# Patient Record
Sex: Male | Born: 1995 | Race: White | Hispanic: No | Marital: Single | Smoking: Current every day smoker
Health system: Southern US, Community
[De-identification: ages and names within clinical notes are randomized; demographics above are authoritative.]

## PROBLEM LIST (undated history)

## (undated) DIAGNOSIS — F419 Anxiety disorder, unspecified: Secondary | ICD-10-CM

## (undated) DIAGNOSIS — R519 Headache, unspecified: Secondary | ICD-10-CM

## (undated) DIAGNOSIS — R51 Headache: Secondary | ICD-10-CM

---

## 2017-11-27 ENCOUNTER — Other Ambulatory Visit: Payer: Self-pay | Admitting: Psychiatry

## 2017-11-27 DIAGNOSIS — G96810 Intracranial hypotension, unspecified: Secondary | ICD-10-CM

## 2017-11-27 DIAGNOSIS — G9389 Other specified disorders of brain: Principal | ICD-10-CM

## 2017-12-17 ENCOUNTER — Ambulatory Visit
Admission: RE | Admit: 2017-12-17 | Discharge: 2017-12-17 | Disposition: A | Discharge status: Home / Self Care | Payer: 59 | Source: Ambulatory Visit | Attending: Psychiatry | Admitting: Psychiatry

## 2017-12-17 ENCOUNTER — Inpatient Hospital Stay
Admission: AD | Admit: 2017-12-17 | Discharge: 2017-12-24 | DRG: 885 | Disposition: A | Discharge status: Home / Self Care | Payer: 59 | Attending: Psychiatry | Admitting: Psychiatry

## 2017-12-17 ENCOUNTER — Encounter: Payer: Self-pay | Admitting: Emergency Medicine

## 2017-12-17 ENCOUNTER — Emergency Department
Admission: EM | Admit: 2017-12-17 | Discharge: 2017-12-17 | Disposition: A | Discharge status: Other Acute Inpatient Hospital | Payer: 59 | Attending: Emergency Medicine | Admitting: Emergency Medicine

## 2017-12-17 ENCOUNTER — Other Ambulatory Visit: Payer: Self-pay

## 2017-12-17 DIAGNOSIS — F312 Bipolar disorder, current episode manic severe with psychotic features: Secondary | ICD-10-CM | POA: Diagnosis not present

## 2017-12-17 DIAGNOSIS — F1220 Cannabis dependence, uncomplicated: Secondary | ICD-10-CM | POA: Diagnosis present

## 2017-12-17 DIAGNOSIS — R51 Headache: Secondary | ICD-10-CM

## 2017-12-17 DIAGNOSIS — G9389 Other specified disorders of brain: Secondary | ICD-10-CM | POA: Insufficient documentation

## 2017-12-17 DIAGNOSIS — G43909 Migraine, unspecified, not intractable, without status migrainosus: Secondary | ICD-10-CM | POA: Diagnosis present

## 2017-12-17 DIAGNOSIS — G96810 Intracranial hypotension, unspecified: Secondary | ICD-10-CM

## 2017-12-17 DIAGNOSIS — F17200 Nicotine dependence, unspecified, uncomplicated: Secondary | ICD-10-CM

## 2017-12-17 DIAGNOSIS — Z79899 Other long term (current) drug therapy: Secondary | ICD-10-CM

## 2017-12-17 DIAGNOSIS — R519 Headache, unspecified: Secondary | ICD-10-CM

## 2017-12-17 DIAGNOSIS — F3010 Manic episode without psychotic symptoms, unspecified: Secondary | ICD-10-CM

## 2017-12-17 DIAGNOSIS — F419 Anxiety disorder, unspecified: Secondary | ICD-10-CM | POA: Diagnosis present

## 2017-12-17 DIAGNOSIS — F17210 Nicotine dependence, cigarettes, uncomplicated: Secondary | ICD-10-CM | POA: Diagnosis present

## 2017-12-17 DIAGNOSIS — R443 Hallucinations, unspecified: Secondary | ICD-10-CM | POA: Diagnosis present

## 2017-12-17 HISTORY — DX: Anxiety disorder, unspecified: F41.9

## 2017-12-17 HISTORY — DX: Headache: R51

## 2017-12-17 HISTORY — DX: Headache, unspecified: R51.9

## 2017-12-17 LAB — CBC
HCT: 46.6 % (ref 39.0–52.0)
Hemoglobin: 16.7 g/dL (ref 13.0–17.0)
MCH: 30.9 pg (ref 26.0–34.0)
MCHC: 35.8 g/dL (ref 30.0–36.0)
MCV: 86.3 fL (ref 80.0–100.0)
PLATELETS: 257 10*3/uL (ref 150–400)
RBC: 5.4 MIL/uL (ref 4.22–5.81)
RDW: 11.9 % (ref 11.5–15.5)
WBC: 10.9 10*3/uL — ABNORMAL HIGH (ref 4.0–10.5)
nRBC: 0 % (ref 0.0–0.2)

## 2017-12-17 LAB — COMPREHENSIVE METABOLIC PANEL
ALT: 12 U/L (ref 0–44)
AST: 22 U/L (ref 15–41)
Albumin: 5.2 g/dL — ABNORMAL HIGH (ref 3.5–5.0)
Alkaline Phosphatase: 111 U/L (ref 38–126)
Anion gap: 10 (ref 5–15)
BUN: 18 mg/dL (ref 6–20)
CHLORIDE: 100 mmol/L (ref 98–111)
CO2: 28 mmol/L (ref 22–32)
CREATININE: 0.99 mg/dL (ref 0.61–1.24)
Calcium: 9.9 mg/dL (ref 8.9–10.3)
Glucose, Bld: 105 mg/dL — ABNORMAL HIGH (ref 70–99)
Potassium: 3.8 mmol/L (ref 3.5–5.1)
Sodium: 138 mmol/L (ref 135–145)
TOTAL PROTEIN: 8.7 g/dL — AB (ref 6.5–8.1)
Total Bilirubin: 1 mg/dL (ref 0.3–1.2)

## 2017-12-17 LAB — URINE DRUG SCREEN, QUALITATIVE (ARMC ONLY)
Amphetamines, Ur Screen: NOT DETECTED
Barbiturates, Ur Screen: NOT DETECTED
Benzodiazepine, Ur Scrn: NOT DETECTED
Cannabinoid 50 Ng, Ur ~~LOC~~: POSITIVE — AB
Cocaine Metabolite,Ur ~~LOC~~: NOT DETECTED
MDMA (Ecstasy)Ur Screen: NOT DETECTED
Methadone Scn, Ur: NOT DETECTED
Opiate, Ur Screen: NOT DETECTED
Phencyclidine (PCP) Ur S: NOT DETECTED
Tricyclic, Ur Screen: NOT DETECTED

## 2017-12-17 LAB — ETHANOL

## 2017-12-17 MED ORDER — TEMAZEPAM 7.5 MG PO CAPS
15.0000 mg | ORAL_CAPSULE | Freq: Every evening | ORAL | Status: DC | PRN
  Administered 2017-12-17: 15 mg via ORAL
  Filled 2017-12-17: qty 2

## 2017-12-17 MED ORDER — GADOBENATE DIMEGLUMINE 529 MG/ML IV SOLN: 7.0000 mL | Freq: Once | INTRAVENOUS | Status: DC | PRN

## 2017-12-17 MED ORDER — LORAZEPAM 2 MG/ML IJ SOLN: 2.0000 mg | INTRAMUSCULAR | Status: DC | PRN

## 2017-12-17 MED ORDER — MAGNESIUM HYDROXIDE 400 MG/5ML PO SUSP: 30.0000 mL | Freq: Every day | ORAL | Status: DC | PRN

## 2017-12-17 MED ORDER — LORAZEPAM 2 MG PO TABS
ORAL_TABLET | ORAL | Status: AC
  Filled 2017-12-17: qty 1

## 2017-12-17 MED ORDER — ACETAMINOPHEN 325 MG PO TABS: 650.0000 mg | ORAL_TABLET | Freq: Four times a day (QID) | ORAL | Status: DC | PRN

## 2017-12-17 MED ORDER — QUETIAPINE FUMARATE 25 MG PO TABS
100.0000 mg | ORAL_TABLET | Freq: Every day | ORAL | Status: DC
  Administered 2017-12-17: 100 mg via ORAL
  Filled 2017-12-17: qty 4

## 2017-12-17 MED ORDER — HYDROXYZINE HCL 50 MG PO TABS
50.0000 mg | ORAL_TABLET | Freq: Three times a day (TID) | ORAL | Status: DC | PRN
  Administered 2017-12-18 – 2017-12-20 (×3): 50 mg via ORAL
  Filled 2017-12-17 (×3): qty 1

## 2017-12-17 MED ORDER — LORAZEPAM 2 MG PO TABS
2.0000 mg | ORAL_TABLET | ORAL | Status: DC | PRN
  Administered 2017-12-18 – 2017-12-20 (×5): 2 mg via ORAL
  Filled 2017-12-17 (×5): qty 1

## 2017-12-17 MED ORDER — GADOBUTROL 1 MMOL/ML IV SOLN
7.0000 mL | Freq: Once | INTRAVENOUS | Status: AC | PRN
  Administered 2017-12-17: 7 mL via INTRAVENOUS

## 2017-12-17 MED ORDER — TEMAZEPAM 15 MG PO CAPS: 15.0000 mg | ORAL_CAPSULE | Freq: Every evening | ORAL | Status: DC | PRN

## 2017-12-17 MED ORDER — ALUM & MAG HYDROXIDE-SIMETH 200-200-20 MG/5ML PO SUSP: 30.0000 mL | ORAL | Status: DC | PRN

## 2017-12-17 MED ORDER — QUETIAPINE FUMARATE 100 MG PO TABS
100.0000 mg | ORAL_TABLET | Freq: Every day | ORAL | Status: DC
  Administered 2017-12-18: 100 mg via ORAL
  Filled 2017-12-17: qty 1

## 2017-12-17 MED ORDER — LORAZEPAM 2 MG PO TABS
2.0000 mg | ORAL_TABLET | ORAL | Status: DC | PRN
  Administered 2017-12-17: 2 mg via ORAL
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
## 2017-12-17 NOTE — ED Notes (Signed)
Pt father name: Leanna SatoSteve Wirkkala  Phone#: [phone]

## 2017-12-17 NOTE — [facility] Group Notes (Signed)
[facility] Group Notes:  (Nursing/MHT/Case Management/Adjunct)  Date:  12/17/2017  Time:  10:29 PM  Type of Therapy:  Group Therapy  Participation Level:  Did Not Attend  Participation Quality:  Pt. was not here.                Mayra NeerJackie L Evy Lutterman 12/17/2017, 10:29 PM
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
## 2017-12-17 NOTE — Tx Team (Signed)
Initial Treatment Plan 12/17/2017 11:55 PM Jack ParentsKenneth Wright RUE:[id number]    PATIENT STRESSORS: Health problems Medication change or noncompliance Substance abuse   PATIENT STRENGTHS: Average or above average intelligence Capable of independent living Supportive family/friends   PATIENT IDENTIFIED PROBLEMS:   Altered mental status     School work anxietyNurse, learning disability( Exams)    Marijuana use           DISCHARGE CRITERIA:  Adequate post-discharge living arrangements Medical problems require only outpatient monitoring Motivation to continue treatment in a less acute level of care Reduction of life-threatening or endangering symptoms to within safe limits  PRELIMINARY DISCHARGE PLAN: Attend 12-step recovery group Outpatient therapy Return to previous living arrangement  PATIENT/FAMILY INVOLVEMENT: This treatment plan has been presented to and reviewed with the patient, Jack ParentsKenneth Wright,   The patient  have been given the opportunity to ask questions and make suggestions.  Lelan PonsAlexander  Chesney Suares, RN 12/17/2017, 11:55 PM

## 2017-12-17 NOTE — ED Provider Notes (Signed)
[facility] Emergency Department Provider Note  ____________________________________________  Time seen: Approximately 10:23 PM  I have reviewed the triage vital signs and the nursing notes.   HISTORY  Chief Complaint Hallucinations and Manic Behavior   HPI Jack Wright is a [age] y.o. male with a history of depression and chronic headaches who presents with his roommates for altered mental status and hallucinations.  Patient's mental status started to deteriorate about a week ago. Patient is manic, talking about inventions and books that he is writing, thinks he was able to crash the internet with his inventions, has not slept in several days. Father flew in last night after noticing the drastic change in patient's behavior. Only recent changes in patient's life is initiation of effexor 3 weeks ago for chronic HAs. Denies alcohol, endorses MJ. Reports vague thoughts about death but no suicidal plan.  History reviewed. No pertinent past medical history.  Patient Active Problem List   Diagnosis Date Noted  . Bipolar affective disorder, manic, severe, with psychotic behavior (HCC) 12/17/2017  . Chronic headache 12/17/2017  . Affective psychosis, bipolar (HCC) 12/17/2017   Allergies Patient has no allergy information on record.  FH Depression - aunt  Social History Alcohol - no Drugs - MJ Smoking - no  Review of Systems  Constitutional: Negative for fever. Eyes: Negative for visual changes. ENT: Negative for sore throat. Neck: No neck pain  Cardiovascular: Negative for chest pain. Respiratory: Negative for shortness of breath. Gastrointestinal: Negative for abdominal pain, vomiting or diarrhea. Genitourinary: Negative for dysuria. Musculoskeletal: Negative for back pain. Skin: Negative for rash. Neurological: Negative for headaches, weakness or numbness. Psych: No SI or HI. + manic  ____________________________________________   PHYSICAL  EXAM:  VITAL SIGNS: ED Triage Vitals  Enc Vitals Group     BP 12/17/17 2007 139/73     Pulse Rate 12/17/17 2007 68     Resp 12/17/17 2007 18     Temp 12/17/17 2007 99.1 F (37.3 C)     Temp Source 12/17/17 2007 Oral     SpO2 12/17/17 2007 99 %     Weight 12/17/17 1710 165 lb (74.8 kg)     Height 12/17/17 1710 5\' 7"  (1.702 m)     Head Circumference --      Peak Flow --      Pain Score 12/17/17 1710 0     Pain Loc --      Pain Edu? --      Excl. in GC? --     Constitutional: Alert and oriented. Well appearing and in no apparent distress. HEENT:      Head: Normocephalic and atraumatic.         Eyes: Conjunctivae are normal. Sclera is non-icteric.       Mouth/Throat: Mucous membranes are moist.       Neck: Supple with no signs of meningismus. Cardiovascular: Regular rate and rhythm. No murmurs, gallops, or rubs. 2+ symmetrical distal pulses are present in all extremities. No JVD. Respiratory: Normal respiratory effort. Lungs are clear to auscultation bilaterally. No wheezes, crackles, or rhonchi.  Gastrointestinal: Soft, non tender, and non distended with positive bowel sounds. No rebound or guarding. Musculoskeletal: Nontender with normal range of motion in all extremities. No edema, cyanosis, or erythema of extremities. Neurologic: Normal speech and language. Face is symmetric. Moving all extremities. No gross focal neurologic deficits are appreciated. Skin: Skin is warm, dry and intact. No rash noted. Psychiatric: Pressure speech, hyperactive, paranoid with dellusions ____________________________________________  LABS (all labs ordered are listed, but only abnormal results are displayed)  Labs Reviewed  COMPREHENSIVE METABOLIC PANEL - Abnormal; Notable for the following components:      Result Value   Glucose, Bld 105 (*)    Total Protein 8.7 (*)    Albumin 5.2 (*)    All other components within normal limits  CBC - Abnormal; Notable for the following components:   WBC  10.9 (*)    All other components within normal limits  URINE DRUG SCREEN, QUALITATIVE ([facility] ONLY) - Abnormal; Notable for the following components:   Cannabinoid 50 Ng, Ur [place] POSITIVE (*)    All other components within normal limits  ETHANOL   ____________________________________________  EKG  none  ____________________________________________  RADIOLOGY  none  ____________________________________________   PROCEDURES  Procedure(s) performed: None Procedures Critical Care performed:  None ____________________________________________   INITIAL IMPRESSION / ASSESSMENT AND PLAN / ED COURSE   22 y.o. male with a history of depression and chronic headaches who presents with his roommates for altered mental status and hallucinations.  Patient with first episode of mania, IVC'ed by Dr. Toni Amend.  Labs for medical clearance with no acute findings.  Drug screen positive for cannabinoids.  Patient was admitted to behavioral health for stabilization.      As part of my medical decision making, I reviewed the following data within the electronic MEDICAL RECORD NUMBER Nursing notes reviewed and incorporated, Labs reviewed , A consult was requested and obtained from this/these consultant(s) Psychiatry, Notes from prior ED visits and [place] Controlled Substance Database    Pertinent labs & imaging results that were available during my care of the patient were reviewed by me and considered in my medical decision making (see chart for details).    ____________________________________________   FINAL CLINICAL IMPRESSION(S) / ED DIAGNOSES  Final diagnoses:  Manic behavior (HCC)      NEW MEDICATIONS STARTED DURING THIS VISIT:  ED Discharge Orders    None       Note:  This document was prepared using Dragon voice recognition software and may include unintentional dictation errors.    Nita Sickle, MD 12/17/17 2231

## 2017-12-17 NOTE — ED Notes (Signed)
Pt to nurse station door demanding his belongings and to speak with an attorney.  Pt threatening to sue the hospital and write about it in his book.  Pt told he is currently under IVC and cannot have his belongings per unit policy.    Maintained on 15 minute checks and observation by security camera for safety.
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
## 2017-12-17 NOTE — ED Triage Notes (Signed)
Pt in with his friends. Pt asking if he can submit a patent here and who takes the documents. Pt friends report that pt was started on venlafaxine for migraines and they doubled his dose a week ago and for the last 2-3 days he has not slept and has been manic.

## 2017-12-17 NOTE — ED Notes (Signed)
Preparing patient for transfer to BMU 

## 2017-12-18 DIAGNOSIS — F17200 Nicotine dependence, unspecified, uncomplicated: Secondary | ICD-10-CM

## 2017-12-18 DIAGNOSIS — F1220 Cannabis dependence, uncomplicated: Secondary | ICD-10-CM | POA: Diagnosis not present

## 2017-12-18 DIAGNOSIS — F312 Bipolar disorder, current episode manic severe with psychotic features: Secondary | ICD-10-CM | POA: Diagnosis not present

## 2017-12-18 LAB — LIPID PANEL
CHOLESTEROL: 186 mg/dL (ref 0–200)
HDL: 52 mg/dL (ref >40)
LDL Cholesterol: 126 mg/dL — ABNORMAL HIGH (ref 0–99)
Total CHOL/HDL Ratio: 3.6 RATIO
Triglycerides: 41 mg/dL (ref <150)
VLDL: 8 mg/dL (ref 0–40)

## 2017-12-18 LAB — HEMOGLOBIN A1C
HEMOGLOBIN A1C: 4.2 % — AB (ref 4.8–5.6)
Mean Plasma Glucose: 73.84 mg/dL

## 2017-12-18 LAB — TSH: TSH: 1.379 u[IU]/mL (ref 0.350–4.500)

## 2017-12-18 MED ORDER — DIPHENHYDRAMINE HCL 25 MG PO CAPS
50.0000 mg | ORAL_CAPSULE | Freq: Three times a day (TID) | ORAL | Status: DC | PRN
  Administered 2017-12-18 – 2017-12-20 (×4): 50 mg via ORAL
  Filled 2017-12-18 (×4): qty 2

## 2017-12-18 MED ORDER — HALOPERIDOL 5 MG PO TABS
5.0000 mg | ORAL_TABLET | Freq: Three times a day (TID) | ORAL | Status: DC | PRN
  Administered 2017-12-18 – 2017-12-20 (×4): 5 mg via ORAL
  Filled 2017-12-18 (×4): qty 1

## 2017-12-18 MED ORDER — OLANZAPINE 5 MG PO TBDP
15.0000 mg | ORAL_TABLET | Freq: Every day | ORAL | Status: DC
  Administered 2017-12-18: 15 mg via ORAL
  Filled 2017-12-18: qty 3

## 2017-12-18 MED ORDER — DIVALPROEX SODIUM 500 MG PO DR TAB
500.0000 mg | DELAYED_RELEASE_TABLET | Freq: Three times a day (TID) | ORAL | Status: DC
  Administered 2017-12-18 – 2017-12-21 (×12): 500 mg via ORAL
  Filled 2017-12-18 (×13): qty 1

## 2017-12-18 NOTE — Tx Team (Addendum)
Interdisciplinary Treatment and Diagnostic Plan Update  12/18/2017 Time of Session: 1030am Jack Wright MRN: [id number]  Principal Diagnosis: <principal problem not specified>  Secondary Diagnoses: Active Problems:   Bipolar affective disorder, manic, severe, with psychotic behavior ([place])   Cannabis use disorder, moderate, dependence ([facility])   Tobacco use disorder   Current Medications:  Current Facility-Administered Medications  Medication Dose Route Frequency Provider Last Rate Last Dose  . acetaminophen (TYLENOL) tablet 650 mg  650 mg Oral Q6H PRN Clapacs, John T, MD      . alum & mag hydroxide-simeth (MAALOX/MYLANTA) 200-200-20 MG/5ML suspension 30 mL  30 mL Oral Q4H PRN Clapacs, John T, MD      . diphenhydrAMINE (BENADRYL) capsule 50 mg  50 mg Oral Q8H PRN Pucilowska, Jolanta B, MD   50 mg at 12/18/17 0916  . divalproex (DEPAKOTE) DR tablet 500 mg  500 mg Oral Q8H Pucilowska, Jolanta B, MD   500 mg at 12/18/17 0915  . haloperidol (HALDOL) tablet 5 mg  5 mg Oral Q8H PRN Pucilowska, Jolanta B, MD   5 mg at 12/18/17 0915  . hydrOXYzine (ATARAX/VISTARIL) tablet 50 mg  50 mg Oral TID PRN Clapacs, Madie Reno, MD   50 mg at 12/18/17 0810  . LORazepam (ATIVAN) tablet 2 mg  2 mg Oral Q4H PRN Clapacs, Madie Reno, MD   2 mg at 12/18/17 0915   Or  . LORazepam (ATIVAN) injection 2 mg  2 mg Intramuscular Q4H PRN Clapacs, John T, MD      . magnesium hydroxide (MILK OF MAGNESIA) suspension 30 mL  30 mL Oral Daily PRN Clapacs, John T, MD      . OLANZapine zydis (ZYPREXA) disintegrating tablet 15 mg  15 mg Oral QHS Pucilowska, Jolanta B, MD      . QUEtiapine (SEROQUEL) tablet 100 mg  100 mg Oral QHS Clapacs, John T, MD      . temazepam (RESTORIL) capsule 15 mg  15 mg Oral QHS PRN Clapacs, Madie Reno, MD       PTA Medications: No medications prior to admission.    Patient Stressors: Health problems Medication change or noncompliance Substance abuse  Patient Strengths: Average or above average  intelligence Capable of independent living Supportive family/friends  Treatment Modalities: Medication Management, Group therapy, Case management,  1 to 1 session with clinician, Psychoeducation, Recreational therapy.   Physician Treatment Plan for Primary Diagnosis: <principal problem not specified> Long Term Goal(s):     Short Term Goals:    Medication Management: Evaluate patient's response, side effects, and tolerance of medication regimen.  Therapeutic Interventions: 1 to 1 sessions, Unit Group sessions and Medication administration.  Evaluation of Outcomes: Not Met  Physician Treatment Plan for Secondary Diagnosis: Active Problems:   Bipolar affective disorder, manic, severe, with psychotic behavior ([place])   Cannabis use disorder, moderate, dependence ([place])   Tobacco use disorder  Long Term Goal(s):     Short Term Goals:       Medication Management: Evaluate patient's response, side effects, and tolerance of medication regimen.  Therapeutic Interventions: 1 to 1 sessions, Unit Group sessions and Medication administration.  Evaluation of Outcomes: Not Met   RN Treatment Plan for Primary Diagnosis: <principal problem not specified> Long Term Goal(s): Knowledge of disease and therapeutic regimen to maintain health will improve  Short Term Goals: Ability to participate in decision making will improve, Ability to verbalize feelings will improve, Ability to disclose and discuss suicidal ideas, Ability to identify and develop effective coping behaviors  will improve and Compliance with prescribed medications will improve  Medication Management: RN will administer medications as ordered by provider, will assess and evaluate patient's response and provide education to patient for prescribed medication. RN will report any adverse and/or side effects to prescribing provider.  Therapeutic Interventions: 1 on 1 counseling sessions, Psychoeducation, Medication administration, Evaluate  responses to treatment, Monitor vital signs and CBGs as ordered, Perform/monitor CIWA, COWS, AIMS and Fall Risk screenings as ordered, Perform wound care treatments as ordered.  Evaluation of Outcomes: Not Met   LCSW Treatment Plan for Primary Diagnosis: <principal problem not specified> Long Term Goal(s): Safe transition to appropriate next level of care at discharge, Engage patient in therapeutic group addressing interpersonal concerns.  Short Term Goals: Engage patient in aftercare planning with referrals and resources  Therapeutic Interventions: Assess for all discharge needs, 1 to 1 time with Social worker, Explore available resources and support systems, Assess for adequacy in community support network, Educate family and significant other(s) on suicide prevention, Complete Psychosocial Assessment, Interpersonal group therapy.  Evaluation of Outcomes: Not Met   Progress in Treatment: Attending groups: No. Participating in groups: No. Taking medication as prescribed: Yes. Toleration medication: Yes. Family/Significant other contact made: No, will contact:  CSW will contact when pt gives consent Patient understands diagnosis: No. Discussing patient identified problems/goals with staff: Yes. Medical problems stabilized or resolved: No. Denies suicidal/homicidal ideation: Yes. Issues/concerns per patient self-inventory: No. Other: NA  New problem(s) identified: No, Describe:  None reported  New Short Term/Long Term Goal(s):"Live a long and healthy sustainable life"  Patient Goals:  "Live a long and healthy sustainable life"  Discharge Plan or Barriers: Pt will return home and follow up with outpatient treatment  Reason for Continuation of Hospitalization: Medication stabilization  Estimated Length of Stay:5-7 days  Recreational Therapy: Patient Stressors: N/A Patient Goal: Patient will focus on task/topic with 2 prompts from staff within 5 recreation therapy group  sessions  Attendees: Patient:Jack Wright 12/18/2017 11:21 AM  Physician: Donato Heinz, MD 12/18/2017 11:21 AM  Nursing: Polly Cobia, RN 12/18/2017 11:21 AM  RN Care Manager: 12/18/2017 11:21 AM  Social Worker: Sanjuana Kava, LCSW 12/18/2017 11:21 AM  Recreational Therapist: Isaias Sakai.  CTRS, LRT 12/18/2017 11:21 AM  Other:  12/18/2017 11:21 AM  Other:  12/18/2017 11:21 AM  Other: 12/18/2017 11:21 AM    Scribe for Treatment Team: Yvette Rack, LCSW 12/18/2017 11:21 AM

## 2017-12-18 NOTE — Plan of Care (Signed)
Patient is not adjusting well in the unit , demands discharge , displaying manic behaviors, seen confronting the security guards and asking them what can I do to get arrested, and what if I pull alarm bell or kick the doors., patient has been medicated but not effective at this time. Patient denies any suicidal tendencies and also denies hallucinations, lacks insights about his medical conditions. dilutions is present patient states that he is writing a book on how to crash the Internet and drain the world money, patient is agitating and irritable, nurse spoke with patient in a therapeutic manner and  calmness, patient was able to retreat to his room with any further incidents, patient then verbally contract for safety of self and others, positive reinforcement   provided. 15 minute safety checks is in progress. No distress   Problem: Education: Goal: Knowledge of General Education information will improve Description Including pain rating scale, medication(s)/side effects and non-pharmacologic comfort measures Outcome: Progressing   Problem: Health Behavior/Discharge Planning: Goal: Ability to manage health-related needs will improve Outcome: Progressing   Problem: Clinical Measurements: Goal: Ability to maintain clinical measurements within normal limits will improve Outcome: Progressing Goal: Will remain free from infection Outcome: Progressing Goal: Diagnostic test results will improve Outcome: Progressing Goal: Respiratory complications will improve Outcome: Progressing Goal: Cardiovascular complication will be avoided Outcome: Progressing   Problem: Activity: Goal: Risk for activity intolerance will decrease Outcome: Progressing   Problem: Nutrition: Goal: Adequate nutrition will be maintained Outcome: Progressing   Problem: Coping: Goal: Level of anxiety will decrease Outcome: Progressing   Problem: Elimination: Goal: Will not experience complications related to bowel  motility Outcome: Progressing Goal: Will not experience complications related to urinary retention Outcome: Progressing   Problem: Pain Managment: Goal: General experience of comfort will improve Outcome: Progressing   Problem: Safety: Goal: Ability to remain free from injury will improve Outcome: Progressing   Problem: Skin Integrity: Goal: Risk for impaired skin integrity will decrease Outcome: Progressing

## 2017-12-18 NOTE — Progress Notes (Signed)
Pt woke up this am, talking about the two books he was writing. He stated that the first book is called "awakening" and the second called "wolves". The books were all about science fiction and cryptocurrency. Pt expressed hope that the books will shut the Internet down when people read about his ideas. The Pt added that he needs smart nurses like us around him because we understand what he is talking about. who really understands what he is talking about. He states that he has not been around smart people and that is why they don't understand him. He then stated that he would pay "us' in cryptocurrency, and it was like a type of deferred payment method. The patient then quickly switched the subject to how his information has been stolen from the Internet and [employer] bank. He mentioned his date of birth and his financial information. The Pt then switched back to his unpublished books and the cryptocurrency business. He states that after he finishes his books, he would hire us as his "staff", because we are smart and we would all work together with cryptocurrency.

## 2017-12-18 NOTE — [facility] Suicide Risk Assessment (Signed)
[facility] Admission Suicide Risk Assessment   Nursing information obtained from:  Patient Demographic factors:  Male Current Mental Status:  NA Loss Factors:  Decline in physical health Historical Factors:  NA Risk Reduction Factors:  Positive therapeutic relationship  Total Time spent with patient: 1 hour Principal Problem: <principal problem not specified> Diagnosis:  Active Problems:   Bipolar affective disorder, manic, severe, with psychotic behavior ([facility])   Cannabis use disorder, moderate, dependence ([facility])   Tobacco use disorder  Subjective Data: psychotic break.  Continued Clinical Symptoms:  Alcohol Use Disorder Identification Test Final Score (AUDIT): 9 The "Alcohol Use Disorders Identification Test", Guidelines for Use in Primary Care, Second Edition.  World Science writer [facility]). Score between 0-7:  no or low risk or alcohol related problems. Score between 8-15:  moderate risk of alcohol related problems. Score between 16-19:  high risk of alcohol related problems. Score 20 or above:  warrants further diagnostic evaluation for alcohol dependence and treatment.   CLINICAL FACTORS:   Bipolar Disorder:   Mixed State Alcohol/Substance Abuse/Dependencies   Musculoskeletal: Strength & Muscle Tone: within normal limits Gait & Station: normal Patient leans: N/A  Psychiatric Specialty Exam: Physical Exam  Nursing note and vitals reviewed. Psychiatric: His affect is labile. His speech is rapid and/or pressured and tangential. He is hyperactive. Thought content is paranoid and delusional. Cognition and memory are normal. He expresses impulsivity.    Review of Systems  Neurological: Negative.   Psychiatric/Behavioral: Negative.   All other systems reviewed and are negative.   Blood pressure (!) 116/95, pulse (!) 108, temperature 97.7 F (36.5 C), temperature source Oral, resp. rate 16, height 5\' 7"  (1.702 m), weight 68.5 kg, SpO2 99 %.Body mass index is 23.65 kg/m.   General Appearance: Casual  Eye Contact:  Good  Speech:  Pressured  Volume:  Increased  Mood:  Angry, Dysphoric and Irritable  Affect:  Labile  Thought Process:  Disorganized, Irrelevant and Descriptions of Associations: Tangential  Orientation:  Full (Time, Place, and Person)  Thought Content:  Illogical, Delusions, Paranoid Ideation and Rumination  Suicidal Thoughts:  No  Homicidal Thoughts:  No  Memory:  Immediate;   Fair Recent;   Fair Remote;   Fair  Judgement:  Poor  Insight:  Lacking  Psychomotor Activity:  Increased  Concentration:  Concentration: Poor and Attention Span: Poor  Recall:  [employer] of Knowledge:  Fair  Language:  Fair  Akathisia:  No  Handed:  Right  AIMS (if indicated):     Assets:  Communication Skills Desire for Improvement Financial Resources/Insurance Housing Physical Health Resilience Social Support Transportation Vocational/Educational  ADL's:  Intact  Cognition:  WNL  Sleep:  Number of Hours: 8.3      COGNITIVE FEATURES THAT CONTRIBUTE TO RISK:  None    SUICIDE RISK:   Mild:  Suicidal ideation of limited frequency, intensity, duration, and specificity.  There are no identifiable plans, no associated intent, mild dysphoria and related symptoms, good self-control (both objective and subjective assessment), few other risk factors, and identifiable protective factors, including available and accessible social support.  PLAN OF CARE: hospital admission, meication management, substance abuse counseling, discharge planning.  Jack Wright is a 22 year old male with a history of depression, anxiety and psychosis admitted for psychotic break  #Psychosis/Mood -Depakote 500 mg TID -Zyprexa 15 mg nightly -Restoril 15 mg nightly PRN  #Labs Lipid panel, TSH, A1C -EKG  #Discharge planning -discharge with family -follow up with a new psychiatrist here and in [place]  I certify that inpatient services furnished can reasonably be expected to  improve the patient's condition.   Jack LineaJolanta Pucilowska, MD 12/18/2017, 4:28 PM

## 2017-12-18 NOTE — Progress Notes (Signed)
LCSW checked in with BMU , staff patient was sleeping so I did not assess. LCSW will check in after 4:30 to see him once he awakens.  Delta Air LinesClaudine Kutter Schnepf LCSW [phone]

## 2017-12-18 NOTE — Progress Notes (Signed)
   12/18/17 1030  Clinical Encounter Type  Visited With Patient;Health care provider  Visit Type Initial;Spiritual support;Behavioral Health  Referral From Social work  Consult/Referral To Chaplain  Spiritual Encounters  Spiritual Needs Emotional;Other (Comment)   CH attended the patient's treatment team meeting. The patient seemed to be agitated and making demands. He requested to be released immediately or he was going to sue the hospital for 100 billion, then he corrected himself with 100 million. He demanded that MD call his lawyer because she was responsible to call since she was being sued. 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 I will continue to follow the patient during hs stay.

## 2017-12-18 NOTE — [facility] Group Notes (Signed)
  LCSW Group Therapy Note Dec 6th,2019 at 1pm  Type of Therapy and Topic:  Group Therapy:  Feelings around Relapse and Recovery  Participation Level:  Did not Attend  Description of Group:    Patients in this group will discuss emotions they experience before and after a relapse. They will process how experiencing these feelings, or avoidance of experiencing them, relates to having a relapse. Facilitator will guide patients to explore emotions they have related to recovery. Patients will be encouraged to process which emotions are more powerful. They will be guided to discuss the emotional reaction significant others in their lives may have to patients' relapse or recovery. Patients will be assisted in exploring ways to respond to the emotions of others without this contributing to a relapse.  Therapeutic Goals: 1. Patient will identify two or more emotions that lead to a relapse for them 2. Patient will identify two emotions that result when they relapse 3. Patient will identify two emotions related to recovery 4. Patient will demonstrate ability to communicate their needs through discussion and/or role plays   Summary of Patient Progress:  Patient did not attend    Therapeutic Modalities:   Cognitive Behavioral Therapy Solution-Focused Therapy Assertiveness Training Relapse Prevention Therapy   Telford NabClaudine Nicholos Aloisi LCSW December 6th,2019 at 1pm

## 2017-12-18 NOTE — H&P (Signed)
Psychiatric Admission Assessment Adult  Patient Identification: Jack Wright MRN:  [id number] Date of Evaluation:  12/18/2017 Chief Complaint:  BIPOLAR Principal Diagnosis: <principal problem not specified> Diagnosis:  Active Problems:   Bipolar affective disorder, manic, severe, with psychotic behavior (HCC)   Cannabis use disorder, moderate, dependence (HCC)   Tobacco use disorder  History of Present Illness:  Identifying data. Mr. Jack Wright is a 22-year mle with a history of depression, anxiety or psychosis.  Chief complaint. "I need my lawyer."  History of present illness. Information wasobtained from the patient, the chart and family. The patient was brought to te ER floridly psychotic: Delusional, grandiose and agitated requiring police intervention. Over several days, the patient became hyperactive, insomniac and unreasonable. 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 His roommates as well as his family were concerned. The roommates disconnected WI-FI to stop his activities. He would not comply with his friends and father's request to seek help and police was brought to the scene. He was committed for psychiatric treatment.  The patient himself denies any problems, is elated, and demands to talk to his lawyer to be discharge immediately to continue his activities.  Spoke with the father who recalls a similar brief episode som years ago. The patient became psychotic, agitated, disrobing, unrully. Police was involved then as well.  The patient suffers severe migraine headach and was started recently on Effexor that was titrated to 75 mg daily by Dr. Marshell Garfinkel, his headache specialist.  Past psychiatric history. Head injury in childhood and another one later on resulting in severe headaches. Several bouts of depression, tried on Wellbutrin and Prozac. There were many other medicines prescribed for headaches: Lamictal,  Topamax, Amitriptiline. He felt suicidal several times and overdosed on Amitriptiline on impulse.  Family psychiatric history. Father recalls a relative with mental illness,no details.  Social history. [employer] in finances. Lives in [place] with his parents and two siblings. Reports moderate drinking "trying to cut back" and cannabis. No other drugs involved. School was going well. He is unlikely be able to take 3 finals next week. Spoke with the father about medical withdrawal.  Total Time spent with patient: 1 hour  Is the patient at risk to self? No.  Has the patient been a risk to self in the past 6 months? No.  Has the patient been a risk to self within the distant past? Yes.    Is the patient a risk to others? No.  Has the patient been a risk to others in the past 6 months? No.  Has the patient been a risk to others within the distant past? No.   Prior Inpatient Therapy:   Prior Outpatient Therapy:    Alcohol Screening: 1. How often do you have a drink containing alcohol?: 2 to 4 times a month 2. How many drinks containing alcohol do you have on a typical day when you are drinking?: 3 or 4 3. How often do you have six or more drinks on one occasion?: Less than monthly AUDIT-C Score: 4 4. How often during the last year have you found that you were not able to stop drinking once you had started?: Less than monthly 5. How often during the last year have you failed to do what was normally expected from you becasue of drinking?: Less than monthly 6. How often during the last year have you needed a first drink in the morning to get yourself going after  a heavy drinking session?: Less than monthly 7. How often during the last year have you had a feeling of guilt of remorse after drinking?: Less than monthly 8. How often during the last year have you been unable to remember what happened the night before because you had been drinking?: Less than monthly 9. Have you or someone  else been injured as a result of your drinking?: No 10. Has a relative or friend or a doctor or another health worker been concerned about your drinking or suggested you cut down?: No Alcohol Use Disorder Identification Test Final Score (AUDIT): 9 Intervention/Follow-up: Alcohol Education Substance Abuse History in the last 12 months:  Yes.   Consequences of Substance Abuse: Negative Previous Psychotropic Medications: Yes  Psychological Evaluations: No  Past Medical History:  Past Medical History:  Diagnosis Date  . Anxiety   . Headache    History reviewed. No pertinent surgical history. Family History: History reviewed. No pertinent family history.  Tobacco Screening: Have you used any form of tobacco in the last 30 days? (Cigarettes, Smokeless Tobacco, Cigars, and/or Pipes): Yes Tobacco use, Select all that apply: 5 or more cigarettes per day Are you interested in Tobacco Cessation Medications?: Yes, will notify MD for an order Counseled patient on smoking cessation including recognizing danger situations, developing coping skills and basic information about quitting provided: Yes Social History:  Social History   Substance and Sexual Activity  Alcohol Use Not on file     Social History   Substance and Sexual Activity  Drug Use Not on file    Additional Social History:                           Allergies:  Not on File Lab Results:  Results for orders placed or performed during the hospital encounter of 12/17/17 (from the past 48 hour(s))  Hemoglobin A1c     Status: Abnormal   Collection Time: 12/17/17  5:15 PM  Result Value Ref Range   Hgb A1c MFr Bld 4.2 (L) 4.8 - 5.6 %    Comment: (NOTE) Pre diabetes:          5.7%-6.4% Diabetes:              >6.4% Glycemic control for   <7.0% adults with diabetes    Mean Plasma Glucose 73.84 mg/dL    Comment: Performed at [facility], [address], [place], [place] [address]  Lipid panel     Status: Abnormal    Collection Time: 12/17/17  5:15 PM  Result Value Ref Range   Cholesterol 186 0 - 200 mg/dL   Triglycerides 41 <604 mg/dL   HDL 52 >54 mg/dL   Total CHOL/HDL Ratio 3.6 RATIO   VLDL 8 0 - 40 mg/dL   LDL Cholesterol 098 (H) 0 - 99 mg/dL    Comment:        Total Cholesterol/HDL:CHD Risk Coronary Heart Disease Risk Table                     Men   Women  1/2 Average Risk   3.4   3.3  Average Risk       5.0   4.4  2 X Average Risk   9.6   7.1  3 X Average Risk  23.4   11.0        Use the calculated Patient Ratio above and the CHD Risk Table to determine  the patient's CHD Risk.        ATP III CLASSIFICATION (LDL):  <100     mg/dL   Optimal  [id number]  mg/dL   Near or Above                    Optimal  130-159  mg/dL   Borderline  [id number]  mg/dL   High  >629>190     mg/dL   Very High Performed at [facility], [address] [place], [place] [address]   TSH     Status: None   Collection Time: 12/17/17  5:15 PM  Result Value Ref Range   TSH 1.379 0.350 - 4.500 uIU/mL    Comment: Performed by a 3rd Generation assay with a functional sensitivity of <=0.01 uIU/mL. Performed at [facility], [address] [place], [place] [address]     Blood Alcohol level:  Lab Results  Component Value Date   [facility] <10 12/17/2017    Metabolic Disorder Labs:  Lab Results  Component Value Date   HGBA1C 4.2 (L) 12/17/2017   MPG 73.84 12/17/2017   No results found for: PROLACTIN Lab Results  Component Value Date   CHOL 186 12/17/2017   TRIG 41 12/17/2017   HDL 52 12/17/2017   CHOLHDL 3.6 12/17/2017   VLDL 8 12/17/2017   LDLCALC 126 (H) 12/17/2017    Current Medications: Current Facility-Administered Medications  Medication Dose Route Frequency Provider Last Rate Last Dose  . acetaminophen (TYLENOL) tablet 650 mg  650 mg Oral Q6H PRN Clapacs, John T, MD      . alum & mag hydroxide-simeth (MAALOX/MYLANTA) 200-200-20 MG/5ML suspension 30 mL  30 mL Oral Q4H PRN Clapacs,  John T, MD      . diphenhydrAMINE (BENADRYL) capsule 50 mg  50 mg Oral Q8H PRN Karlina Suares B, MD   50 mg at 12/18/17 0916  . divalproex (DEPAKOTE) DR tablet 500 mg  500 mg Oral Q8H Akshita Italiano B, MD   500 mg at 12/18/17 1454  . haloperidol (HALDOL) tablet 5 mg  5 mg Oral Q8H PRN Sylus Stgermain B, MD   5 mg at 12/18/17 0915  . hydrOXYzine (ATARAX/VISTARIL) tablet 50 mg  50 mg Oral TID PRN Clapacs, Jackquline DenmarkJohn T, MD   50 mg at 12/18/17 0810  . LORazepam (ATIVAN) tablet 2 mg  2 mg Oral Q4H PRN Clapacs, Jackquline DenmarkJohn T, MD   2 mg at 12/18/17 0915   Or  . LORazepam (ATIVAN) injection 2 mg  2 mg Intramuscular Q4H PRN Clapacs, John T, MD      . magnesium hydroxide (MILK OF MAGNESIA) suspension 30 mL  30 mL Oral Daily PRN Clapacs, John T, MD      . OLANZapine zydis (ZYPREXA) disintegrating tablet 15 mg  15 mg Oral QHS Brette Cast B, MD      . QUEtiapine (SEROQUEL) tablet 100 mg  100 mg Oral QHS Clapacs, John T, MD      . temazepam (RESTORIL) capsule 15 mg  15 mg Oral QHS PRN Clapacs, Jackquline DenmarkJohn T, MD       PTA Medications: No medications prior to admission.    Musculoskeletal: Strength & Muscle Tone: within normal limits Gait & Station: normal Patient leans: N/A  Psychiatric Specialty Exam: I reviewed physical examination performed in the ER and agree with the findings. Physical Exam  Nursing note and vitals reviewed. Psychiatric: His affect is angry, labile and inappropriate. His speech is rapid and/or pressured.  He is hyperactive. Thought content is paranoid and delusional. Cognition and memory are normal. He expresses impulsivity.    Review of Systems  Neurological: Negative.   Psychiatric/Behavioral: Negative.   All other systems reviewed and are negative.   Blood pressure (!) 116/95, pulse (!) 108, temperature 97.7 F (36.5 C), temperature source Oral, resp. rate 16, height 5\' 7"  (1.702 m), weight 68.5 kg, SpO2 99 %.Body mass index is 23.65 kg/m.  See SRA                                                   Sleep:  Number of Hours: 8.3    Treatment Plan Summary: Daily contact with patient to assess and evaluate symptoms and progress in treatment and Medication management   Mr. Bayus is a 22 year old male with a history of depression, anxiety and psychosis admitted for psychotic break  #Psychosis/Mood -Depakote 500 mg TID -Zyprexa 15 mg nightly -Restoril 15 mg nightly PRN  #Labs Lipid panel, TSH, A1C -EKG  #Discharge planning -discharge with family -follow up with a new psychiatrist here and in [place]   Observation Level/Precautions:  15 minute checks  Laboratory:  CBC Chemistry Profile UDS UA  Psychotherapy:    Medications:    Consultations:    Discharge Concerns:    Estimated LOS:  Other:     Physician Treatment Plan for Primary Diagnosis: <principal problem not specified> Long Term Goal(s): Improvement in symptoms so as ready for discharge  Short Term Goals: Ability to identify changes in lifestyle to reduce recurrence of condition will improve, Ability to verbalize feelings will improve, Ability to disclose and discuss suicidal ideas, Ability to demonstrate self-control will improve, Ability to identify and develop effective coping behaviors will improve, Ability to maintain clinical measurements within normal limits will improve, Compliance with prescribed medications will improve and Ability to identify triggers associated with substance abuse/mental health issues will improve  Physician Treatment Plan for Secondary Diagnosis: Active Problems:   Bipolar affective disorder, manic, severe, with psychotic behavior (HCC)   Cannabis use disorder, moderate, dependence (HCC)   Tobacco use disorder  Long Term Goal(s): Improvement in symptoms so as ready for discharge  Short Term Goals: Ability to identify changes in lifestyle to reduce recurrence of condition will improve, Ability to demonstrate self-control will improve and  Ability to identify triggers associated with substance abuse/mental health issues will improve  I certify that inpatient services furnished can reasonably be expected to improve the patient's condition.    Kristine Linea, MD 12/6/20194:40 PM

## 2017-12-18 NOTE — Plan of Care (Signed)
Patient is back and forth to his room and insisting for discharge and his lawyer.Patient is hyper verbal with pressured speech and with no insight.Patient is grandiose and talking about his company.Patient called 911 multiple times to get from the police to get out from here.Staff informed patient that his phone privilege will be taken off from him and patient agreed that he will not call again.Compliant with medications.Appetite and energy level good. Denies SI,HI and AVH.Support and encouragement given.

## 2017-12-18 NOTE — Progress Notes (Signed)
Recreation Therapy Notes  INPATIENT RECREATION THERAPY ASSESSMENT  Patient Details Name: Elpidio Neenan MRN: [id number] DOB: 12/Lynder Parents10/1997 Today's Date: 12/18/2017       Information Obtained From: Patient  Able to Participate in Assessment/Interview: No  Patient Presentation: Confused, Resistant, Anxious, Hyperverbal(Patient could not stay on topic and is upset because he is still here. He is fixated on sueing the hospital )  Reason for Admission (Per Patient): Active Symptoms  Patient Stressors:    Coping Skills:      Leisure Interests (2+):     Frequency of Recreation/Participation:    Awareness of Community Resources:     [employer] Resources:     Current Use:    If no, Barriers?:    Expressed Interest in [employer]Community Resource Information:    [place]     Patient Main Form of Transportation:    Patient Strengths:     Patient Identified Areas of Improvement:     Patient Goal for Hospitalization:     Current SI (including self-harm):     Current HI:     Current AVH:    Staff Intervention Plan:    Consent to Intern Participation:    Roniya Tetro 12/18/2017, 1:49 PM

## 2017-12-18 NOTE — Progress Notes (Signed)
Recreation Therapy Notes  Date: 12/18/2017  Time: 9:30 pm   Location: Craft Room   Behavioral response: N/A   Intervention Topic: Teamwork  Discussion/Intervention: Patient did not attend group.   Clinical Observations/Feedback:  Patient did not attend group.   Rhetta Cleek LRT/CTRS         Concettina Leth 12/18/2017 11:33 AM 

## 2017-12-18 NOTE — Progress Notes (Signed)
Pt admitted to the unit from the ED. Pt was very sleepy and tired and stated that he was given a "bunch of meds in the ED and didn't even know what it was they gave him". The patient ended the statement with something incomprehensible about the state of the [place] health care [place]. Pt was able to answer some admission questions but was very tired and requested to go to bed. He stated that he had not slept in the past few days, but was feeling very sleepy at the moment. Pt expressed his desire to return back to school soon because his final exams was coming up in a few days time. He also wanted information about when he would be discharged and added that he did not know why he was brought here. Pt's thought process is disorganized. Behavior is appropriate to situation, mood and affect is blunted. Unit guidelines, expectations, and behavior discussed with the patient. Pt denies SI/HI/AVH.  Pt was offered cold tray and beverages, but declined. Hygiene products were provided. Pt declined unit orientation because he was very sleepy with an unsteady gait. Room orientation provided. Two nurse body check complete. Skin is clean and no contraband found. Pt is aware of Q15 minute rounding for safety and acknowledges his understanding.

## 2017-12-19 ENCOUNTER — Ambulatory Visit: Admission: RE | Admit: 2017-12-19 | Payer: 59 | Source: Ambulatory Visit

## 2017-12-19 DIAGNOSIS — F1220 Cannabis dependence, uncomplicated: Secondary | ICD-10-CM

## 2017-12-19 DIAGNOSIS — F312 Bipolar disorder, current episode manic severe with psychotic features: Principal | ICD-10-CM

## 2017-12-19 MED ORDER — OLANZAPINE 5 MG PO TBDP
15.0000 mg | ORAL_TABLET | Freq: Two times a day (BID) | ORAL | Status: DC
  Administered 2017-12-19 – 2017-12-24 (×10): 15 mg via ORAL
  Filled 2017-12-19 (×10): qty 3

## 2017-12-19 MED ORDER — HALOPERIDOL LACTATE 5 MG/ML IJ SOLN: 5.0000 mg | Freq: Four times a day (QID) | INTRAMUSCULAR | Status: DC | PRN

## 2017-12-19 NOTE — Progress Notes (Signed)
[facility] MD Progress Note  12/19/2017 11:39 AM Jack Wright  MRN:  [id number] Subjective:  Pt seen and chart reviewed. Jack Wright demanded to be discharged, and threatened to "walk out of here and no one can stop me" or "I am going to sue you!" and told the writer "you are stupid" when Jack Wright is told that "I think you still need to stay in the hospital!"   Jack Wright was in the circular conversation with the writer about discharge, and was difficult to redirect.     Jack Wright continues to be grandiose and delusional.  Jack Wright said that someone hacked his email, which "got me to the hospital!"  Jack Wright said that the doctor Jack Wright saw yesterday, faked the credential.  Jack Wright on the other hand doesn't remember who was the psychiatrist Jack Wright saw yesterday.  Then Jack Wright said that Jack Wright single handedly "shut down the linkin, etc" other social media because "they were sharing medical information"  Jack Wright believes that Jack Wright did it to protect others.   RN: Jack Wright does take his meds.  Jack Wright received PRN ativan 2mg  x2 and RPN haldol 5mg  x2 yesterday in addition to the standing zyprexa and seroquel, and depakote.    Principal Problem: <principal problem not specified> Diagnosis: Active Problems:   Bipolar affective disorder, manic, severe, with psychotic behavior (HCC)   Cannabis use disorder, moderate, dependence (HCC)   Tobacco use disorder  Total Time spent with patient: 30 minutes  Past Psychiatric History: Head injury in childhood and another one later on resulting in severe headaches. Several bouts of depression, tried on Wellbutrin and Prozac. There were many other medicines prescribed for headaches: Lamictal, Topamax, Amitriptiline. Jack Wright felt suicidal several times and overdosed on Amitriptiline on impulse.  Past Medical History:  Past Medical History:  Diagnosis Date  . Anxiety   . Headache    History reviewed. No pertinent surgical history. Family History: History reviewed. No pertinent family history. Family Psychiatric  History: Father recalls a relative with  mental illness,no details Social History:  Social History   Substance and Sexual Activity  Alcohol Use Not on file     Social History   Substance and Sexual Activity  Drug Use Not on file    Social History   Socioeconomic History  . Marital status: Single    Spouse name: Not on file  . Number of children: Not on file  . Years of education: Not on file  . Highest education level: Not on file  Occupational History  . Not on file  Social Needs  . Financial resource strain: Not on file  . Food insecurity:    Worry: Not on file    Inability: Not on file  . Transportation needs:    Medical: Not on file    Non-medical: Not on file  Tobacco Use  . Smoking status: Current Every Day Smoker    Packs/day: 0.25    Years: 2.00    Pack years: 0.50    Types: Cigarettes  . Smokeless tobacco: Current User  . Tobacco comment: coop  Substance and Sexual Activity  . Alcohol use: Not on file  . Drug use: Not on file  . Sexual activity: Not on file  Lifestyle  . Physical activity:    Days per week: Not on file    Minutes per session: Not on file  . Stress: Not on file  Relationships  . Social connections:    Talks on phone: Not on file    Gets together: Not on file  Attends religious service: Not on file    Active member of club or organization: Not on file    Attends meetings of clubs or organizations: Not on file    Relationship status: Not on file  Other Topics Concern  . Not on file  Social History Narrative  . Not on file   Additional Social History: Solicitor in finances. Lives in [place] with his parents and two siblings. Reports moderate drinking "trying to cut back" and cannabis. No other drugs involved. School was going well. Jack Wright is unlikely be able to take 3 finals next week. Spoke with the father about medical withdrawal.  Sleep: Fair  Appetite:  Fair  Current Medications: Current Facility-Administered Medications  Medication Dose Route  Frequency Provider Last Rate Last Dose  . acetaminophen (TYLENOL) tablet 650 mg  650 mg Oral Q6H PRN Clapacs, John T, MD      . alum & mag hydroxide-simeth (MAALOX/MYLANTA) 200-200-20 MG/5ML suspension 30 mL  30 mL Oral Q4H PRN Clapacs, John T, MD      . diphenhydrAMINE (BENADRYL) capsule 50 mg  50 mg Oral Q8H PRN Pucilowska, Jolanta B, MD   50 mg at 12/18/17 1826  . divalproex (DEPAKOTE) DR tablet 500 mg  500 mg Oral Q8H Pucilowska, Jolanta B, MD   500 mg at 12/19/17 0519  . haloperidol (HALDOL) tablet 5 mg  5 mg Oral Q8H PRN Pucilowska, Jolanta B, MD   5 mg at 12/18/17 1826  . haloperidol lactate (HALDOL) injection 5 mg  5 mg Intramuscular Q6H PRN Shalece Staffa, MD      . hydrOXYzine (ATARAX/VISTARIL) tablet 50 mg  50 mg Oral TID PRN Clapacs, Jackquline Denmark, MD   50 mg at 12/19/17 1122  . LORazepam (ATIVAN) tablet 2 mg  2 mg Oral Q4H PRN Clapacs, Jackquline Denmark, MD   2 mg at 12/18/17 1826   Or  . LORazepam (ATIVAN) injection 2 mg  2 mg Intramuscular Q4H PRN Clapacs, John T, MD      . magnesium hydroxide (MILK OF MAGNESIA) suspension 30 mL  30 mL Oral Daily PRN Clapacs, Jackquline Denmark, MD      . OLANZapine zydis (ZYPREXA) disintegrating tablet 15 mg  15 mg Oral BID Anakin Varkey, MD        Lab Results:  Results for orders placed or performed during the hospital encounter of 12/17/17 (from the past 48 hour(s))  Hemoglobin A1c     Status: Abnormal   Collection Time: 12/17/17  5:15 PM  Result Value Ref Range   Hgb A1c MFr Bld 4.2 (L) 4.8 - 5.6 %    Comment: (NOTE) Pre diabetes:          5.7%-6.4% Diabetes:              >6.4% Glycemic control for   <7.0% adults with diabetes    Mean Plasma Glucose 73.84 mg/dL    Comment: Performed at [facility], [address], [place], [place] [address]  Lipid panel     Status: Abnormal   Collection Time: 12/17/17  5:15 PM  Result Value Ref Range   Cholesterol 186 0 - 200 mg/dL   Triglycerides 41 <191 mg/dL   HDL 52 >47 mg/dL   Total CHOL/HDL Ratio 3.6 RATIO   VLDL 8 0 - 40  mg/dL   LDL Cholesterol 829 (H) 0 - 99 mg/dL    Comment:        Total Cholesterol/HDL:CHD Risk Coronary Heart Disease Risk  Table                     Men   Women  1/2 Average Risk   3.4   3.3  Average Risk       5.0   4.4  2 X Average Risk   9.6   7.1  3 X Average Risk  23.4   11.0        Use the calculated Patient Ratio above and the CHD Risk Table to determine the patient's CHD Risk.        ATP III CLASSIFICATION (LDL):  <100     mg/dL   Optimal  161-096  mg/dL   Near or Above                    Optimal  130-159  mg/dL   Borderline  045-409  mg/dL   High  >811     mg/dL   Very High Performed at [facility], [address] [place], [place] [address]   TSH     Status: None   Collection Time: 12/17/17  5:15 PM  Result Value Ref Range   TSH 1.379 0.350 - 4.500 uIU/mL    Comment: Performed by a 3rd Generation assay with a functional sensitivity of <=0.01 uIU/mL. Performed at [facility], [address] [place], [place] [address]     Blood Alcohol level:  Lab Results  Component Value Date   [facility] <10 12/17/2017    Metabolic Disorder Labs: Lab Results  Component Value Date   HGBA1C 4.2 (L) 12/17/2017   MPG 73.84 12/17/2017   No results found for: PROLACTIN Lab Results  Component Value Date   CHOL 186 12/17/2017   TRIG 41 12/17/2017   HDL 52 12/17/2017   CHOLHDL 3.6 12/17/2017   VLDL 8 12/17/2017   LDLCALC 126 (H) 12/17/2017    Physical Findings: AIMS:  , ,  ,  ,    CIWA:  CIWA-Ar Total: 2 COWS:  COWS Total Score: 2  Musculoskeletal: Strength & Muscle Tone: within normal limits Gait & Station: normal Patient leans: N/A  Psychiatric Specialty Exam: Physical Exam  ROS  Blood pressure (!) 106/54, pulse (!) 110, temperature 98.1 F (36.7 C), temperature source Oral, resp. rate 16, height 5\' 7"  (1.702 m), weight 68.5 kg, SpO2 98 %.Body mass index is 23.65 kg/m.  General Appearance: Fairly Groomed  Eye Contact:  Good  Speech:  Clear  and Coherent  Volume:  Normal  Mood:  Angry  Affect:  Non-Congruent, Constricted and Restricted  Thought Process:  Disorganized  Orientation:  Full (Time, Place, and Person)  Thought Content:  Delusions  Suicidal Thoughts:  No  Homicidal Thoughts:  No  Memory:  Immediate;   Poor Recent;   Poor Remote;   Fair  Judgement:  Poor  Insight:  Lacking  Psychomotor Activity:  Increased  Concentration:  Concentration: Poor and Attention Span: Poor  Recall:  [employer] of Knowledge:  Fair  Language:  Fair      AIMS (if indicated):     Assets:  Desire for Improvement  ADL's:  Intact  Cognition:  Impaired,  Mild  Sleep:  Number of Hours: 7.75     Treatment Plan Summary: Daily contact with patient to assess and evaluate symptoms and progress in treatment and Medication management  Mr. Nettleton is a 22 year old male with a history of depression, anxiety and psychosis admitted for psychotic break.  While  compliant with meds, Jaxxson Cavanah continues to be disorganized and delusional, with grandiosity and has NO insight of his mental illness.  Javaris Wigington doesn't seem to understand what is reality, what is not.    #Psychosis/ Mania -Depakote 500 mg TID -INCREASE Zyprexa to 15mg  BID -DISCONTINUE Seroquel 100mg , to avoid 2 antipsychotics concurrent use.  -CONTINUE to provide ativan 2mg  q6hr prn and haldol 5mg  Q6hr prn for acute agitation, mania, and with IM backup for both meds.  - STOP Restoril 15 mg nightly PRN, as pt has ativan PRN already.   Cannabis use disorder -- Cannabis use can trigger or worsen his mania and psychosis, especially the synthetic, high potency products, which pt admitted using from [place] his [place].  -- recommend substance abuse counseling, but pt has no intention to stop.   #Labs Lipid panel, TSH, A1C -EKG: QTc is 445  #Discharge planning -defer to primary team -likely discharge with family -follow up with a new psychiatrist here and in [place]  Shivonne Schwartzman,  MD 12/19/2017, 11:39 AM

## 2017-12-19 NOTE — Plan of Care (Signed)
Patient stated that he is going to write a book to save the mental health patients and he earn "millions" out of it.Patient was angry and asking for discharge.Patient states "yesterday when you give me the medicines I checked the labels.Today I am not checking because I trust you."Patient attended groups.Continues to have grandiose and delusional thoughts.Denies SI,HI and AVH.Compliant with medications.Appetite and energy level good.Support and encouragement given.

## 2017-12-19 NOTE — [facility] Group Notes (Signed)
LCSW Group Therapy Note   12/19/2017 1:15pm   Type of Therapy and Topic:  Group Therapy:  Trust and Honesty  Participation Level:  Active  Description of Group:    In this group patients will be asked to explore the value of being honest.  Patients will be guided to discuss their thoughts, feelings, and behaviors related to honesty and trusting in others. Patients will process together how trust and honesty relate to forming relationships with peers, family members, and self. Each patient will be challenged to identify and express feelings of being vulnerable. Patients will discuss reasons why people are dishonest and identify alternative outcomes if one was truthful (to self or others). This group will be process-oriented, with patients participating in exploration of their own experiences, giving and receiving support, and processing challenge from other group members.   Therapeutic Goals: 1. Patient will identify why honesty is important to relationships and how honesty overall affects relationships.  2. Patient will identify a situation where they lied or were lied too and the  feelings, thought process, and behaviors surrounding the situation 3. Patient will identify the meaning of being vulnerable, how that feels, and how that correlates to being honest with self and others. 4. Patient will identify situations where they could have told the truth, but instead lied and explain reasons of dishonesty.   Summary of Patient Progress: The patient reported that he feels "awake." The patient was able to explore the value of being honest.  Patient discussed thoughts, feelings, and behaviors related to honesty and trusting in others. The patient processed together with other group members how trust and honesty relate to forming relationships with peers, family members, and self. Pt actively and appropriately engaged in the group. Patient was able to provide support and validation to other group members.  Patient practiced active listening when interacting with the facilitator and other group members.     Therapeutic Modalities:   Cognitive Behavioral Therapy Solution Focused Therapy Motivational Interviewing Brief Therapy  Malyiah Fellows  CUEBAS-COLON, LCSW 12/19/2017 12:55 PM

## 2017-12-20 DIAGNOSIS — F1220 Cannabis dependence, uncomplicated: Secondary | ICD-10-CM | POA: Diagnosis not present

## 2017-12-20 DIAGNOSIS — F312 Bipolar disorder, current episode manic severe with psychotic features: Secondary | ICD-10-CM | POA: Diagnosis not present

## 2017-12-20 NOTE — Progress Notes (Signed)
D: Pt denies SI/HI/AVH. Pt is pleasant and cooperative. Pt stated he was doing ok, pt presented disorganized  With flight of ideas and appeared to have disorganized thought process. A: Pt was offered support and encouragement. Pt was given scheduled medications. Pt was encourage to attend groups. Q 15 minute checks were done for safety.  R: Pt is taking medication. Pt has no complaints.Pt receptive to treatment and safety maintained on unit.  Problem: Clinical Measurements: Goal: Will remain free from infection Outcome: Progressing   Problem: Activity: Goal: Risk for activity intolerance will decrease Outcome: Progressing   Problem: Nutrition: Goal: Adequate nutrition will be maintained Outcome: Progressing   Problem: Coping: Goal: Level of anxiety will decrease Outcome: Progressing

## 2017-12-20 NOTE — Progress Notes (Signed)
6 PM: Patient sitting in dayroom with father talking, no aggressive behaviors noted.  7PM: Patient laying down in his bed, eyes open, respirations even, no distress noted, MHT within arms reach.

## 2017-12-20 NOTE — Progress Notes (Addendum)
Patient at this time is observed by BHT in his room doing push-ups and then proceeds to walk up to the nursing station door requesting to speak with staff. Pt. Outside of nursing station is visibly upset, anxious, and experiencing increased agitation. Pt. While interacting with this writer is disorganized in his thought process and makes statements such as, "I hit the gold mine didn't I", "you don't understand, this is going to happen to so many more people if this information doesn't get out there", "I don't want other people to have to work with a lawyer to get through this stuff". Pt. When asked follow-up questions to explore what the patient is referring to, states, "I'm talking about my medical records being corrupted". Pt. Given support and encouragement at this time along with PRN medications to comfort the patient for safety. Pt. Encouraged to try to sleep to promote sleep hygiene. Will continue to monitor.

## 2017-12-20 NOTE — Plan of Care (Signed)
Patient is alert and oriented to self and place, patient is disoriented to time and situation. Patient denies SI, HI and AVH. Patient is very restless, impulsive and hyperactive. Patient has to be redirected and reminded of appropriate behaviors for example not laying down in the middle of the floor or doing pushups in the middle of the floor in the hallways. Patient is very delusional, flight of ideas present. Patient has some pressured speech. Patient rates pain 0/10. Patient states,"I don't know when the last time I had a BM was." RN will talk to Psychiatrist about possible stool softener being added, patient verbalized agreement. Safety checks Q 15 minutes to continue. Problem: Education: Goal: Knowledge of General Education information will improve Description Including pain rating scale, medication(s)/side effects and non-pharmacologic comfort measures Outcome: Not Progressing   Problem: Health Behavior/Discharge Planning: Goal: Ability to manage health-related needs will improve Outcome: Not Progressing   Problem: Clinical Measurements: Goal: Ability to maintain clinical measurements within normal limits will improve Outcome: Not Progressing Goal: Will remain free from infection Outcome: Not Progressing Goal: Diagnostic test results will improve Outcome: Not Progressing Goal: Respiratory complications will improve Outcome: Not Progressing Goal: Cardiovascular complication will be avoided Outcome: Not Progressing

## 2017-12-20 NOTE — Progress Notes (Addendum)
Was the fall witnessed:  Yes, Virl SonKeith BHT  Patient condition before and after the fall:  Unchanged  Patient's reaction to the fall: "I'm okay"  Name of the doctor/House Supervisor that was notified including date and time:   Dr. He, MD at [facility]06:28am   Alisha, RN at 06:26am  Any interventions and vital signs: Q15 vital signs x 2, Palpation for tenderness, assess for pain, full physical assessment, full neuro check (LOC, pupils, and grips), ROM.    Description of the incident   Patient during morning vital signs fell while getting out of his chair. Pt. Per BHT report was assisted to the ground then escorted back to his room. Pt. Assessed by this RN with full work up per POST FALL ASSESSMENT policy. Pt. Denies pain to this RN. Pt. Has no signs of distress or injury. Pt. Given high falls risk education. Pt. Vital signs obtained per protocol. Pt. Moved closer to the nursing station for safety. On call MD and house supervisor notified of fall this morning. No new orders received. Will endorse care to next shift for continued following of post fall protocol.

## 2017-12-20 NOTE — [facility] Suicide Risk Assessment (Signed)
[facility] INPATIENT:  Family/Significant Other Suicide Prevention Education  Suicide Prevention Education:  Patient Refusal for Family/Significant Other Suicide Prevention Education: The patient Jack Wright has refused to provide written consent for family/significant other to be provided Family/Significant Other Suicide Prevention Education during admission and/or prior to discharge.  Physician notified.  Delmont Prosch  CUEBAS-COLON 12/20/2017, 4:52 PM

## 2017-12-20 NOTE — Plan of Care (Signed)
Pt. Denies si/hi/avh, verbally is able to contract for safety. Pt. Reports to this writer he can remain safe while on the unit.    Problem: Safety: Goal: Ability to remain free from injury will improve Outcome: Progressing

## 2017-12-20 NOTE — Progress Notes (Signed)
[facility] MD Progress Note  12/20/2017 10:30 AM [place]  MRN:  [id number] Subjective:  Pt seen and chart reviewed. [place] continues to be very intrusive, and argumentative. [place] doesn't understand why [place] is here in the hospital, and doesn't think Jack [place] did anything unusual.  [place] still believes that [place] hack and brother the social media.  Jack [place] demands repeatedly to leave the hospital and wants to call the police to "get me out of here"  [place] was in the circular conversation with the writer about discharge, and was difficult to redirect.     RN: Jack [place] does take his meds.  Jack [place] received PRN ativan 2mg  x2 and RPN haldol 5mg  x2 yesterday, and haldol prn x1 with ativan 2mg  x1 4 am.   Principal Problem: <principal problem not specified> Diagnosis: Active Problems:   Bipolar affective disorder, manic, severe, with psychotic behavior (HCC)   Cannabis use disorder, moderate, dependence (HCC)   Tobacco use disorder  Total Time spent with patient: 30 minutes  Past Psychiatric History: Head injury in childhood and another one later on resulting in severe headaches. Several bouts of depression, tried on Wellbutrin and Prozac. There were many other medicines prescribed for headaches: Lamictal, Topamax, Amitriptiline. [place] felt suicidal several times and overdosed on Amitriptiline on impulse.  Past Medical History:  Past Medical History:  Diagnosis Date  . Anxiety   . Headache    History reviewed. No pertinent surgical history. Family History: History reviewed. No pertinent family history. Family Psychiatric  History: Father recalls a relative with mental illness,no details Social History:  Social History   Substance and Sexual Activity  Alcohol Use Not on file     Social History   Substance and Sexual Activity  Drug Use Not on file    Social History   Socioeconomic History  . Marital status: Single    Spouse name: Not on file  . Number of children: Not on file  . Years of education: Not on file  .  Highest education level: Not on file  Occupational History  . Not on file  Social Needs  . Financial resource strain: Not on file  . Food insecurity:    Worry: Not on file    Inability: Not on file  . Transportation needs:    Medical: Not on file    Non-medical: Not on file  Tobacco Use  . Smoking status: Current Every Day Smoker    Packs/day: 0.25    Years: 2.00    Pack years: 0.50    Types: Cigarettes  . Smokeless tobacco: Current User  . Tobacco comment: coop  Substance and Sexual Activity  . Alcohol use: Not on file  . Drug use: Not on file  . Sexual activity: Not on file  Lifestyle  . Physical activity:    Days per [place]: Not on file    Minutes per session: Not on file  . Stress: Not on file  Relationships  . Social connections:    Talks on phone: Not on file    Gets together: Not on file    Attends religious service: Not on file    Active member of club or organization: Not on file    Attends meetings of clubs or organizations: Not on file    Relationship status: Not on file  Other Topics Concern  . Not on file  Social History Narrative  . Not on file   Additional Social History: Solicitor in finances. Lives in [place] with his  parents and two siblings. Reports moderate drinking "trying to cut back" and cannabis. No other drugs involved. School was going well. [place]. Spoke with the father about medical withdrawal.  Sleep: Fair  Appetite:  Fair  Current Medications: Current Facility-Administered Medications  Medication Dose Route Frequency Provider Last Rate Last Dose  . acetaminophen (TYLENOL) tablet 650 mg  650 mg Oral Q6H PRN Clapacs, John T, MD      . alum & mag hydroxide-simeth (MAALOX/MYLANTA) 200-200-20 MG/5ML suspension 30 mL  30 mL Oral Q4H PRN Clapacs, John T, MD      . diphenhydrAMINE (BENADRYL) capsule 50 mg  50 mg Oral Q8H PRN Pucilowska, Jolanta B, MD   50 mg at 12/20/17 0408  . divalproex  (DEPAKOTE) DR tablet 500 mg  500 mg Oral Q8H Pucilowska, Jolanta B, MD   500 mg at 12/20/17 0625  . haloperidol (HALDOL) tablet 5 mg  5 mg Oral Q8H PRN Pucilowska, Jolanta B, MD   5 mg at 12/20/17 0409  . haloperidol lactate (HALDOL) injection 5 mg  5 mg Intramuscular Q6H PRN Dionne Rossa, MD      . hydrOXYzine (ATARAX/VISTARIL) tablet 50 mg  50 mg Oral TID PRN Clapacs, Jackquline Denmark, MD   50 mg at 12/19/17 1122  . LORazepam (ATIVAN) tablet 2 mg  2 mg Oral Q4H PRN Clapacs, Jackquline [place], MD   2 mg at 12/20/17 0408   Or  . LORazepam (ATIVAN) injection 2 mg  2 mg Intramuscular Q4H PRN Clapacs, John T, MD      . magnesium hydroxide (MILK OF MAGNESIA) suspension 30 mL  30 mL Oral Daily PRN Clapacs, John T, MD      . OLANZapine zydis (ZYPREXA) disintegrating tablet 15 mg  15 mg Oral BID Deantae Shackleton, MD   15 mg at 12/20/17 0818    Lab Results:  No results found for this or any previous visit (from the past 48 hour(s)).  Blood Alcohol level:  Lab Results  Component Value Date   ETH <10 12/17/2017    Metabolic Disorder Labs: Lab Results  Component Value Date   HGBA1C 4.2 (L) 12/17/2017   MPG 73.84 12/17/2017   No results found for: PROLACTIN Lab Results  Component Value Date   CHOL 186 12/17/2017   TRIG 41 12/17/2017   HDL 52 12/17/2017   CHOLHDL 3.6 12/17/2017   VLDL 8 12/17/2017   LDLCALC 126 (H) 12/17/2017    Physical Findings: AIMS:  , ,  ,  ,    CIWA:  CIWA-Ar Total: 2 COWS:  COWS Total Score: 2  Musculoskeletal: Strength & Muscle Tone: within normal limits Gait & Station: normal Patient leans: N/A  Psychiatric Specialty Exam: Physical Exam   ROS   Blood pressure 129/65, pulse 92, temperature (!) 97.5 F (36.4 C), temperature source Oral, resp. rate 16, height 5\' 7"  (1.702 m), weight 68.5 kg, SpO2 98 %.Body mass index is 23.65 kg/m.  General Appearance: Fairly Groomed  Eye Contact:  Good  Speech:  Clear and Coherent  Volume:  Normal  Mood:  Angry, Depressed and Irritable  Affect:   Non-Congruent, Constricted, Restricted and Tearful  Thought Process:  Disorganized, Irrelevant and Descriptions of Associations: Tangential  Orientation:  Full (Time, Place, and Person)  Thought Content:  Delusions  Suicidal Thoughts:  No  Homicidal Thoughts:  No  Memory:  Immediate;   Poor Recent;   Poor Remote;   Fair  Judgement:  Poor  Insight:  Lacking  Psychomotor Activity:  Increased  Concentration:  Concentration: Poor and Attention Span: Poor  Recall:  Fis[employer]Fair  Fund of Knowledge:  Fair  Language:  Fair      AIMS (if indicated):     Assets:  Desire for Improvement  ADL's:  Intact  Cognition:  Impaired,  Mild  Sleep:  Number of Hours: 3.75     Treatment Plan Summary: Daily contact with patient to assess and evaluate symptoms and progress in treatment and Medication management  [name] is a 22 year old male with a history of depression, anxiety and psychosis admitted for psychotic break.  While compliant with meds, Brydon Spahr continues to be disorganized and delusional, with grandiosity and has NO insight of his mental illness.  Myrene Bougher doesn't seem to understand what is reality, what is not.  So far, Zachariah Pavek has not responded to the current regimen.   #Psychosis/ Mania -Depakote 500 mg TID -INCREASE Zyprexa to 15mg  BID -DISCONTINUE Seroquel 100mg , to avoid 2 antipsychotics concurrent use.  -CONTINUE to provide ativan 2mg  q6hr prn and haldol 5mg  Q6hr prn for acute agitation, mania, and with IM backup for both meds.  Eavan Gonterman has been needing PRN 1-2x a day so far.  - STOP Restoril 15 mg nightly PRN, as pt has ativan PRN already.   Cannabis use disorder -- Cannabis use can trigger or worsen his mania and psychosis, especially the synthetic, high potency products, which pt [place] using from [place].  -- recommend substance abuse counseling, but pt has no intention to stop.   #Labs Lipid panel, TSH, A1C -EKG: QTc is 445  #Discharge planning -defer to primary  team -likely discharge with family -follow up with a new psychiatrist here and in [place]  Ceejay Kegley, MD 12/20/2017, 10:30 AM

## 2017-12-20 NOTE — Progress Notes (Signed)
RN [facility] a [facility] from [facility] patient has called the Police department to say he was being held against his will. RN explained to office patient is IVC on a psych unit. Patient has been talked to about calling 911.

## 2017-12-20 NOTE — [facility] Group Notes (Signed)
LCSW Group Therapy Note 12/20/2017 1:15pm  Type of Therapy and Topic: Group Therapy: Feelings Around Returning Home & Establishing a Supportive Framework and Supporting Oneself When Supports Not Available  Participation Level: Active  Description of Group:  Patients first processed thoughts and feelings about upcoming discharge. These included fears of upcoming changes, lack of change, new living environments, judgements and expectations from others and overall stigma of mental health issues. The group then discussed the definition of a supportive framework, what that looks and feels like, and how do to discern it from an unhealthy non-supportive network. The group identified different types of supports as well as what to do when your family/friends are less than helpful or unavailable  Therapeutic Goals  1. Patient will identify one healthy supportive network that they can use at discharge. 2. Patient will identify one factor of a supportive framework and how to tell it from an unhealthy network. 3. Patient able to identify one coping skill to use when they do not have positive supports from others. 4. Patient will demonstrate ability to communicate their needs through discussion and/or role plays.  Summary of Patient Progress:  The patient reported he feels "stressed." Pt engaged during group session. As patients processed their anxiety about discharge and described healthy supports patient shared he is ready to be discharge. He stated "I've proven my capabilities to be part of society." He listed his parents and friends as his main support.  Patients identified at least one self-care tool they were willing to use after discharge; reading and lifting weight.   Therapeutic Modalities Cognitive Behavioral Therapy Motivational Interviewing   Tamara Monteith  CUEBAS-COLON, LCSW 12/20/2017 12:52 PM

## 2017-12-20 NOTE — [facility] Counselor (Signed)
Adult Comprehensive Assessment  Patient ID: [place], male   DOB: [birth year], 22 y.o.   MRN: [id number]  Information Source: Information source: Patient  Current Stressors:  Patient states their primary concerns and needs for treatment are:: "someone hacked into my files" Patient states their goals for this hospitilization and ongoing recovery are:: "more of the social aspect of it" Educational / Learning stressors: none reported Employment / Job issues: none reported Family Relationships: "goodEngineer, petroleum / Lack of resources (include bankruptcy): parents support Housing / Lack of housing: stable Physical health (include injuries & life threatening diseases): migraines Social relationships: "good" Substance abuse: ETOH, THC Bereavement / Loss: none reported  Living/Environment/Situation:  Living Arrangements: Other (Comment) Who else lives in the home?: pt reports he lives in an apartment with a roommate How long has patient lived in current situation?: 1 year What is atmosphere in current home: Comfortable  Family History:  Marital status: Single Are you sexually active?: Yes What is your sexual orientation?: heterosexual Has your sexual activity been affected by drugs, alcohol, medication, or emotional stress?: yes Does patient have children?: No  Childhood History:  By whom was/is the patient raised?: Both parents Description of patient's relationship with caregiver when they were a child: "good but my mom was too strict" Patient's description of current relationship with people who raised him/her: "good" How were you disciplined when you got in trouble as a child/adolescent?: "spanking" Does patient have siblings?: Yes Number of Siblings: 2 Description of patient's current relationship with siblings: pt reports he has an older sister and a younger brother and they have a great relationship Did patient suffer any verbal/emotional/physical/sexual abuse as a  child?: No Did patient suffer from severe childhood neglect?: No Has patient ever been sexually abused/assaulted/raped as an adolescent or adult?: No Was the patient ever a victim of a crime or a disaster?: No Witnessed domestic violence?: No Has patient been effected by domestic violence as an adult?: No  Education:  Highest grade of school patient has completed: some college Currently a student?: Yes Name of school: [employer] How long has the patient attended?: 2 years Learning disability?: No  Employment/Work Situation:   Employment situation: Surveyor, minerals job has been impacted by current illness: No What is the longest time patient has a held a job?: 8 months Where was the patient employed at that time?: [employer] Did You Receive Any Psychiatric Treatment/Services While in the [employer]?: No Are There Guns or Other Weapons in Your Home?: No  Financial Resources:   Surveyor, quantity resources: Support from parents / caregiver, Media planner Does patient have a Lawyer or guardian?: No  Alcohol/Substance Abuse:   What has been your use of drugs/alcohol within the last 12 months?: ETOH- 2-3 beers daily, Cannabis- daily If attempted suicide, did drugs/alcohol play a role in this?: No Alcohol/Substance Abuse Treatment Hx: Denies past history Has alcohol/substance abuse ever caused legal problems?: No  Social Support System:   Conservation officer, nature Support System: Good Describe Community Support System: family and friends Type of faith/religion: none reported How does patient's faith help to cope with current illness?: none reported  Leisure/Recreation:   Leisure and Hobbies: "criptocurreny"  Strengths/Needs:   What is the patient's perception of their strengths?: "analytics" Patient states they can use these personal strengths during their treatment to contribute to their recovery: "I got the be consistant and healthy" Patient states these  barriers may affect/interfere with their treatment: none reported Patient states these barriers may affect  their return to the community: none reported  Discharge Plan:   Currently receiving community mental health services: No Patient states concerns and preferences for aftercare planning are: Pt reports he wants to be referred to a psychiatrist Patient states they will know when they are safe and ready for discharge when: "because all the pain that I've gone through" Does patient have access to transportation?: Yes(dad will pick him up) Does patient have financial barriers related to discharge medications?: No Will patient be returning to same living situation after discharge?: Yes  Summary/Recommendations:   Summary and Recommendations (to be completed by the evaluator): Patient is a 22 year old male admitted involuntarily and diagnosed with Bipolar affective disorder, manic, severe, with psychotic behavior (HCC), Cannabis use disorder, moderate, dependence (HCC), Tobacco use disorder. The patient was brought to the ER floridly psychotic: Delusional, grandiose and agitated requiring police intervention. Over several days, the patient became hyperactive, insomniac and unreasonable. Patient will benefit from crisis stabilization, medication evaluation, group therapy and psychoeducation. In addition to case management for discharge planning. At discharge it is recommended that patient adhere to the established discharge plan and continue treatment.   [place]  [place]. 12/20/2017

## 2017-12-20 NOTE — Progress Notes (Signed)
D:  Upon this writer's assessment the patient is pleasant and cooperative, but disorganized in his thought process, expressing delusional insight into why he is here as well as other delusional statements. Pt. Reports he is here, because, "the russian's hacked into my medical records" and asked questions randomly such as, "is facebook, instagram, and myspace back online yet?". Pt. Reports during our conversation that he shutdown the major social media networks. Pt. Denies si/hi/avh, verbally contracts for safety when asked. Pt. Is visibly anxious in his presentation. Pt. Affect is animated and bizarre with eyes wide-open. Pt. Denies depression during assessments, but reports anxiety is high. Denies pain.       A: Q x 15 minute observation checks were completed for safety. Patient was provided with education, but shows no signs of learning.  Patient was given/offered medications per orders. Patient  was encourage to attend groups, participate in unit activities and continue with plan of care. Pt. Chart and plans of care reviewed. Pt. Given support and encouragement.   R: Patient is complaint with medication and unit procedures with direction/redirection and encouragement. Pt. Attends snacks, groups, and unit activities appropriately. Pt. Given PRN medications for reported/visible anxiety and agitations for comfort of the patient.   Pt. Is frequently throughout the night up at the nursing station asking bizarre questions and making bizarre statements, restless and agitated/anxious. Pt. Redirected and encouraged during these times to try to sleep to promote sleep hygiene. PRN medications given for comfort and safety.              Precautionary checks every 15 minutes for safety maintained, room free of safety hazards, patient sustains no injury or falls during this shift. Will endorse care to next shift.

## 2017-12-20 NOTE — Progress Notes (Signed)
Nursing 1:1 notes: 1900 D:Pt observed walking on the unit with sitter. RR even and unlabored. No distress noted. A: 1:1 observation continues for safety  R: pt remains safe  2000 D:Pt observed sitting in dayroom. RR even and unlabored. No distress noted. A: 1:1 observation continues for safety  R: pt remains safe     2100 D:Pt observed sitting on bed . RR even and unlabored. No distress noted. A: 1:1 observation continues for safety  R: pt remains safe    2200 D:Pt observed sleeping in bed . RR even and unlabored. No distress noted. A: 1:1 observation continues for safety  R: pt remains safe

## 2017-12-20 NOTE — Progress Notes (Signed)
Patient has attempted to elope from unit twice within the past hour. RN talked with doctor and agreed patient should be on 1:1 sitter case due to elopement attempts.

## 2017-12-21 DIAGNOSIS — F312 Bipolar disorder, current episode manic severe with psychotic features: Secondary | ICD-10-CM | POA: Diagnosis not present

## 2017-12-21 LAB — AMMONIA: Ammonia: 51 umol/L — ABNORMAL HIGH (ref 9–35)

## 2017-12-21 LAB — VALPROIC ACID LEVEL: Valproic Acid Lvl: 105 ug/mL — ABNORMAL HIGH (ref 50.0–100.0)

## 2017-12-21 NOTE — Progress Notes (Signed)
Recreation Therapy Notes   Date: 12/21/2017  Time: 9:30 am  Location: Craft Room  Behavioral response: Appropriate, Off topic, Disorganized  Intervention Topic: Self-Care  Discussion/Intervention:  Group content today was focused on Self-Care. The group defined self-care and some positive ways they care for themselves. Individuals expressed ways and reasons why they neglected any self-care in the past. Patients described ways to improve self-care in the future. The group explained what could happen if they did not do any self-care activities at all. The group participated in the intervention "self-care assessment" where they had a chance to discover some of their weaknesses and strengths in self- care. Patient came up with a self-care plan to improve themselves in the future.  Clinical Observations/Feedback:  Patient came to group and stated having healthy habits help improve self-care. He identified consistency and a routine helps him care for himself. 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 Patient left group early due to unknown reasons and never returned to group. Patrick Salemi LRT/CTRS         [place] 12/21/2017 12:01 PM

## 2017-12-21 NOTE — Progress Notes (Addendum)
[facility] MD Progress Note  12/21/2017 9:58 AM Jack Wright  MRN:  [id number]  Subjective:   Jack Wright is grandiose, unreasonable, intrusive and difficult to redirect. Over the weekend, he tried to elope and has 1:1 sitter now. He met with treatment team but has not been able to participate due to thought disorganization. This morning he started by arguing with me that he is kept here illegally and wants to sue. He wants me to provide him a Chief Executive Officer. He hands over a "memorandum of understanding" note, where he expresses his belief that he is here voluntarily and should be allowed to leave.   He has been petitioned by his father and is here on IVC commitment. His court hearing will be on 12/17 at which time the patient will have an opportunity to present his case to the judge if he is still in the hospital.  He has been compliant with medications of Depakote and Zyprexa and seems to tolerate them well. Will check VPA level in 2 days. Unfortunately, he has made no progress over the weekend.   Spoke for over an hour with the father in person and the mother on the phone. Family dissatisfied that I discussed diagnosis with the patient instead of living it up to them to communicate to "avoid trauma".   Principal Problem: Bipolar affective disorder, manic, severe, with psychotic behavior ([place]) Diagnosis: Principal Problem:   Bipolar affective disorder, manic, severe, with psychotic behavior ([place]) Active Problems:   Cannabis use disorder, moderate, dependence ([place])   Tobacco use disorder  Total Time spent with patient: 20 minutes  Past Psychiatric History: depression, psychosis  Past Medical History:  Past Medical History:  Diagnosis Date  . Anxiety   . Headache    History reviewed. No pertinent surgical history. Family History: History reviewed. No pertinent family history. Family Psychiatric  History: unknown mental illness in relative on his father's side Social History:  Social History    Substance and Sexual Activity  Alcohol Use Not on file     Social History   Substance and Sexual Activity  Drug Use Not on file    Social History   Socioeconomic History  . Marital status: Single    Spouse name: Not on file  . Number of children: Not on file  . Years of education: Not on file  . Highest education level: Not on file  Occupational History  . Not on file  Social Needs  . Financial resource strain: Not on file  . Food insecurity:    Worry: Not on file    Inability: Not on file  . Transportation needs:    Medical: Not on file    Non-medical: Not on file  Tobacco Use  . Smoking status: Current Every Day Smoker    Packs/day: 0.25    Years: 2.00    Pack years: 0.50    Types: Cigarettes  . Smokeless tobacco: Current User  . Tobacco comment: coop  Substance and Sexual Activity  . Alcohol use: Not on file  . Drug use: Not on file  . Sexual activity: Not on file  Lifestyle  . Physical activity:    Days per week: Not on file    Minutes per session: Not on file  . Stress: Not on file  Relationships  . Social connections:    Talks on phone: Not on file    Gets together: Not on file    Attends religious service: Not on file    Active  member of club or organization: Not on file    Attends meetings of clubs or organizations: Not on file    Relationship status: Not on file  Other Topics Concern  . Not on file  Social History Narrative  . Not on file   Additional Social History:                         Sleep: Fair  Appetite:  Fair  Current Medications: Current Facility-Administered Medications  Medication Dose Route Frequency Provider Last Rate Last Dose  . acetaminophen (TYLENOL) tablet 650 mg  650 mg Oral Q6H PRN Clapacs, John T, MD      . alum & mag hydroxide-simeth (MAALOX/MYLANTA) 200-200-20 MG/5ML suspension 30 mL  30 mL Oral Q4H PRN Clapacs, John T, MD      . diphenhydrAMINE (BENADRYL) capsule 50 mg  50 mg Oral Q8H PRN Pucilowska,  Jolanta B, MD   50 mg at 12/20/17 0408  . divalproex (DEPAKOTE) DR tablet 500 mg  500 mg Oral Q8H Pucilowska, Jolanta B, MD   500 mg at 12/21/17 0818  . haloperidol (HALDOL) tablet 5 mg  5 mg Oral Q8H PRN Pucilowska, Jolanta B, MD   5 mg at 12/20/17 0409  . haloperidol lactate (HALDOL) injection 5 mg  5 mg Intramuscular Q6H PRN He, Jun, MD      . hydrOXYzine (ATARAX/VISTARIL) tablet 50 mg  50 mg Oral TID PRN Clapacs, Madie Reno, MD   50 mg at 12/20/17 2126  . LORazepam (ATIVAN) tablet 2 mg  2 mg Oral Q4H PRN Clapacs, Madie Reno, MD   2 mg at 12/20/17 0408   Or  . LORazepam (ATIVAN) injection 2 mg  2 mg Intramuscular Q4H PRN Clapacs, John T, MD      . magnesium hydroxide (MILK OF MAGNESIA) suspension 30 mL  30 mL Oral Daily PRN Clapacs, John T, MD      . OLANZapine zydis (ZYPREXA) disintegrating tablet 15 mg  15 mg Oral BID He, Jun, MD   15 mg at 12/21/17 0818    Lab Results: No results found for this or any previous visit (from the past 18 hour(s)).  Blood Alcohol level:  Lab Results  Component Value Date   ETH <10 81/44/8185    Metabolic Disorder Labs: Lab Results  Component Value Date   HGBA1C 4.2 (L) 12/17/2017   MPG 73.84 12/17/2017   No results found for: PROLACTIN Lab Results  Component Value Date   CHOL 186 12/17/2017   TRIG 41 12/17/2017   HDL 52 12/17/2017   CHOLHDL 3.6 12/17/2017   VLDL 8 12/17/2017   LDLCALC 126 (H) 12/17/2017    Physical Findings: AIMS:  , ,  ,  ,    CIWA:  CIWA-Ar Total: 2 COWS:  COWS Total Score: 2  Musculoskeletal: Strength & Muscle Tone: within normal limits Gait & Station: normal Patient leans: N/A  Psychiatric Specialty Exam: Physical Exam  Nursing note and vitals reviewed. Psychiatric: His affect is angry, labile and inappropriate. His speech is rapid and/or pressured. He is hyperactive. Thought content is paranoid and delusional. Cognition and memory are impaired. He expresses impulsivity.    Review of Systems  Neurological: Negative.    Psychiatric/Behavioral: Negative.   All other systems reviewed and are negative.   Blood pressure 134/75, pulse 84, temperature 97.8 F (36.6 C), temperature source Oral, resp. rate 18, height '5\' 7"'$  (1.702 m), weight 68.5 kg, SpO2 99 %.Body mass index  is 23.65 kg/m.  General Appearance: Casual  Eye Contact:  Good  Speech:  Pressured  Volume:  Increased  Mood:  Angry, Dysphoric and Irritable  Affect:  Congruent  Thought Process:  Irrelevant  Orientation:  Full (Time, Place, and Person)  Thought Content:  Delusions and Paranoid Ideation  Suicidal Thoughts:  No  Homicidal Thoughts:  No  Memory:  Immediate;   Fair Recent;   Fair Remote;   Fair  Judgement:  Poor  Insight:  Lacking  Psychomotor Activity:  Increased  Concentration:  Concentration: Fair and Attention Span: Fair  Recall:  [employer] of Knowledge:  Fair  Language:  Fair  Akathisia:  No  Handed:  Right  AIMS (if indicated):     Assets:  Communication Skills Desire for Improvement Financial Resources/Insurance Housing Physical Health Resilience Social Support Transportation Vocational/Educational  ADL's:  Intact  Cognition:  Impaired,  Mild  Sleep:  Number of Hours: 7.3     Treatment Plan Summary: Daily contact with patient to assess and evaluate symptoms and progress in treatment and Medication management   Jack Wright is a 22 year old male with a history of depression, anxiety and psychosis admitted for psychotic break.  While compliant with meds, he continues to be disorganized and delusional, with grandiosity and has NO insight of his mental illness.  He doesn't seem to understand what is reality, what is not.  So far, he has not responded to the current regimen.   #Psychosis/ Mania, no improvement -Depakote 500 mg TID -Zyprexa 15 mg BID  -CONTINUE ativan '2mg'$  q6hr prn and haldol '5mg'$  Q6hr prn for acute agitation, mania, and with IM backup for both meds   Cannabis use disorder -cannabis use can trigger  or worsen his mania and psychosis, especially the synthetic, high potency products, which pt admitted using from [place], which is his hometown  -recommend substance abuse counseling, but pt has no intention to stop  #Labs -Lipid panel, TSH, A1C are normal -EKG: QTc is 445  #Discharge planning -discharge with family -follow up with a new psychiatrist here and in [place], MD 12/21/2017, 9:58 AM

## 2017-12-21 NOTE — Progress Notes (Signed)
12 pm: Patient is eating lunch in dayroom with peers and MHT. Patient is in no distress.  1 PM: Patient is attending group with social worker, MHT and peers, no distress noted.  2 PM: Patient asleep in room, breaths even and unlabored.  No distress noted.  3 PM: Patient in dayroom watching television, with peers, MHT present.  4 PM: Patient  Came to nurses station and apologize to Nurse stating, "I think I am ready to stop having people follow me around now I promise I will behave. I understand what I did in the past but I won't let you down." RN explained Expected behaviors to patient. RN explained to patient 1:1 sitter will continue throughout the night and depending on behavior tomorrow sitter case may be dismissed. Patient verbalized understanding and stated, "That sounds good."  5PM: Patient eating dinner in the dayroom, and received medications, patient denies any discomfort at this time. MHT present.

## 2017-12-21 NOTE — Progress Notes (Signed)
6 PM- 7 PM  Patient in dayroom with MHT and visitors. Patient has visitors friends from school and also his Father came to visit. Patient acting appropriately during visitation. Patient interacting well.

## 2017-12-21 NOTE — [facility] Group Notes (Signed)
[facility] LCSW Group Therapy Note  Date/Time: 12/21/17, 1300  Type of Therapy and Topic:  Group Therapy:  Overcoming Obstacles  Participation Level:  active  Description of Group:    In this group patients will be encouraged to explore what they see as obstacles to their own wellness and recovery. They will be guided to discuss their thoughts, feelings, and behaviors related to these obstacles. The group will process together ways to cope with barriers, with attention given to specific choices patients can make. Each patient will be challenged to identify changes they are motivated to make in order to overcome their obstacles. This group will be process-oriented, with patients participating in exploration of their own experiences as well as giving and receiving support and challenge from other group members.  Therapeutic Goals: 1. Patient will identify personal and current obstacles as they relate to admission. 2. Patient will identify barriers that currently interfere with their wellness or overcoming obstacles.  3. Patient will identify feelings, thought process and behaviors related to these barriers. 4. Patient will identify two changes they are willing to make to overcome these obstacles:    Summary of Patient Progress: Pt shared that identity theft and alcohol are current obstacles in his life.  Pt was very active during group discussion regarding positive ways to overcome obstacles, however, pt does not display much insight into his current hospitalization or mental health condition.        Therapeutic Modalities:   Cognitive Behavioral Therapy Solution Focused Therapy Motivational Interviewing Relapse Prevention Therapy  Daleen SquibbGreg Jevante Hollibaugh, LCSW

## 2017-12-21 NOTE — Progress Notes (Signed)
0700: Patient in room, asleep; respirations even and unlabored. No distress noted. Patient asked RN to call mother. RN called patients mother and transferred telephone to patient. RN reminded patient to give his mother the telephone number  0800: Patient has eaten breakfast, brushed teeth, taken morning medications and now walking around the unit with MHT within arms reach. No distress noted.  0900: Patient pacing the hallways with MHT within reach. Patient continues with delusions of being the inventor of facebook and social media. Patient tells nurse his information has been hacked and  "The system is going to crash because I am here. I was brought here voluntarily because I usually don't talk about coding and its true because I am not a coder."  10:00: Patient in room laying on bed eyes open talking with MHT who is sitting in a chair. No distress noted.  11AM: Patient in Dayroom with peers watching television and writing in journal.

## 2017-12-21 NOTE — Progress Notes (Signed)
Nursing 1:1 note 2300 D:Pt observed sleeping in bed with eyes closed. RR even and unlabored. No distress noted. A: 1:1 observation continues for safety  R: pt remains safe  0000 D:Pt observed sleeping in bed with eyes closed. RR even and unlabored. No distress noted. A: 1:1 observation continues for safety  R: pt remains safe  0100 D:Pt observed sleeping in bed with eyes closed. RR even and unlabored. No distress noted. A: 1:1 observation continues for safety  R: pt remains safe  0200 D:Pt observed sleeping in bed with eyes closed. RR even and unlabored. No distress noted. A: 1:1 observation continues for safety  R: pt remains safe

## 2017-12-22 DIAGNOSIS — F312 Bipolar disorder, current episode manic severe with psychotic features: Secondary | ICD-10-CM | POA: Diagnosis not present

## 2017-12-22 MED ORDER — DIVALPROEX SODIUM 500 MG PO DR TAB
500.0000 mg | DELAYED_RELEASE_TABLET | Freq: Two times a day (BID) | ORAL | Status: DC
  Administered 2017-12-22 – 2017-12-23 (×2): 500 mg via ORAL
  Filled 2017-12-22 (×2): qty 1

## 2017-12-22 NOTE — Progress Notes (Signed)
Patient is socializing appropriately with friends and compliant with safety guide lines, attends groups with good participation, no bizarre behaviors, contract for safety of self and others, appetite is good , mood is good and affect is congruent with mood, patient denies any anxiety or depression, reports that he is taking his medicines, and responding well with out any side effect, patient also denies any SI/HI/AVH at this time, thinking is logical and thought content is appropriate ,. Education and support is encouraged and provided, 15 minute safety rounding is maintained no distress.

## 2017-12-22 NOTE — Progress Notes (Signed)
1:1 Patient Hourly Rounding  0800: Patient is in the dayroom with his assigned safety sitter present.  0900: Patient is in his room with his assigned safety sitter present.

## 2017-12-22 NOTE — Progress Notes (Signed)
[facility]  12/22/2017 1:58 PM Jack Wright  MRN:  [id number]  Subjective:    Mr. Jack Wright feels better. He is cool, collected and cooperative. We were able to discontinue sitter. Tolerates medications well. Last night his VPA level was 105. We will switch to night time dose. There is no delusional content, unwanted behaviors or intrusiveness. To the contrary, he now thinks that other manic patient are annoying and talk too much.  Principal Problem: Bipolar affective disorder, manic, severe, with psychotic behavior ([facility]) Diagnosis: Principal Problem:   Bipolar affective disorder, manic, severe, with psychotic behavior ([facility]) Active Problems:   Cannabis use disorder, moderate, dependence ([facility])   Tobacco use disorder  Total Time spent with patient: 20 minutes  Past Psychiatric History: anxiety, psychosis  Past Medical History:  Past Medical History:  Diagnosis Date  . Anxiety   . Headache    History reviewed. No pertinent surgical history. Family History: History reviewed. No pertinent family history. Family Psychiatric  History: none Social History:  Social History   Substance and Sexual Activity  Alcohol Use Not on file     Social History   Substance and Sexual Activity  Drug Use Not on file    Social History   Socioeconomic History  . Marital status: Single    Spouse name: Not on file  . Number of children: Not on file  . Years of education: Not on file  . Highest education level: Not on file  Occupational History  . Not on file  Social Needs  . Financial resource strain: Not on file  . Food insecurity:    Worry: Not on file    Inability: Not on file  . Transportation needs:    Medical: Not on file    Non-medical: Not on file  Tobacco Use  . Smoking status: Current Every Day Smoker    Packs/day: 0.25    Years: 2.00    Pack years: 0.50    Types: Cigarettes  . Smokeless tobacco: Current User  . Tobacco comment: coop  Substance and Sexual  Activity  . Alcohol use: Not on file  . Drug use: Not on file  . Sexual activity: Not on file  Lifestyle  . Physical activity:    Days per week: Not on file    Minutes per session: Not on file  . Stress: Not on file  Relationships  . Social connections:    Talks on phone: Not on file    Gets together: Not on file    Attends religious service: Not on file    Active member of club or organization: Not on file    Attends meetings of clubs or organizations: Not on file    Relationship status: Not on file  Other Topics Concern  . Not on file  Social History Narrative  . Not on file   Additional Social History:                         Sleep: Fair  Appetite:  Fair  Current Medications: Current Facility-Administered Medications  Medication Dose Route Frequency Provider Last Rate Last Dose  . acetaminophen (TYLENOL) tablet 650 mg  650 mg Oral Q6H PRN Clapacs, John T, MD      . alum & mag hydroxide-simeth (MAALOX/MYLANTA) 200-200-20 MG/5ML suspension 30 mL  30 mL Oral Q4H PRN Clapacs, John T, MD      . diphenhydrAMINE (BENADRYL) capsule 50 mg  50 mg Oral  Q8H PRN Ibrahem Volkman B, MD   50 mg at 12/20/17 0408  . haloperidol (HALDOL) tablet 5 mg  5 mg Oral Q8H PRN Nychelle Cassata B, MD   5 mg at 12/20/17 0409  . haloperidol lactate (HALDOL) injection 5 mg  5 mg Intramuscular Q6H PRN He, Jun, MD      . hydrOXYzine (ATARAX/VISTARIL) tablet 50 mg  50 mg Oral TID PRN Clapacs, Jackquline Denmark, MD   50 mg at 12/20/17 2126  . LORazepam (ATIVAN) tablet 2 mg  2 mg Oral Q4H PRN Clapacs, Jackquline Denmark, MD   2 mg at 12/20/17 0408   Or  . LORazepam (ATIVAN) injection 2 mg  2 mg Intramuscular Q4H PRN Clapacs, John T, MD      . magnesium hydroxide (MILK OF MAGNESIA) suspension 30 mL  30 mL Oral Daily PRN Clapacs, John T, MD      . OLANZapine zydis (ZYPREXA) disintegrating tablet 15 mg  15 mg Oral BID He, Jun, MD   15 mg at 12/22/17 0981    Lab Results:  Results for orders placed or performed  during the hospital encounter of 12/17/17 (from the past 48 hour(s))  Valproic acid level     Status: Abnormal   Collection Time: 12/21/17  8:17 PM  Result Value Ref Range   Valproic Acid Lvl 105 (H) 50.0 - 100.0 ug/mL    Comment: Performed at [facility], [address] [place], [place] [address]  Ammonia     Status: Abnormal   Collection Time: 12/21/17  8:17 PM  Result Value Ref Range   Ammonia 51 (H) 9 - 35 umol/L    Comment: Performed at [facility], [address] [place], [place] [address]    Blood Alcohol level:  Lab Results  Component Value Date   [facility] <10 12/17/2017    Metabolic Disorder Labs: Lab Results  Component Value Date   HGBA1C 4.2 (L) 12/17/2017   MPG 73.84 12/17/2017   No results found for: PROLACTIN Lab Results  Component Value Date   CHOL 186 12/17/2017   TRIG 41 12/17/2017   HDL 52 12/17/2017   CHOLHDL 3.6 12/17/2017   VLDL 8 12/17/2017   LDLCALC 126 (H) 12/17/2017    Physical Findings: AIMS:  , ,  ,  ,    CIWA:  CIWA-Ar Total: 2 COWS:  COWS Total Score: 2  Musculoskeletal: Strength & Muscle Tone: within normal limits Gait & Station: normal Patient leans: N/A  Psychiatric Specialty Exam: Physical Exam  Nursing note and vitals reviewed. Psychiatric: He has a normal mood and affect. His speech is normal and behavior is normal. Thought content is delusional. Cognition and memory are normal. He expresses impulsivity.    Review of Systems  Neurological: Negative.   Psychiatric/Behavioral: Negative.   All other systems reviewed and are negative.   Blood pressure (!) 144/82, pulse (!) 116, temperature 97.8 F (36.6 C), temperature source Oral, resp. rate 16, height 5\' 7"  (1.702 m), weight 68.5 kg, SpO2 95 %.Body mass index is 23.65 kg/m.  General Appearance: Casual  Eye Contact:  Good  Speech:  Clear and Coherent  Volume:  Normal  Mood:  Euthymic  Affect:  Appropriate  Thought Process:  Goal Directed and  Descriptions of Associations: Intact  Orientation:  Full (Time, Place, and Person)  Thought Content:  WDL  Suicidal Thoughts:  No  Homicidal Thoughts:  No  Memory:  Immediate;   Fair Recent;   Fair Remote;  Fair  Judgement:  Poor  Insight:  Lacking  Psychomotor Activity:  Normal  Concentration:  Concentration: Fair and Attention Span: Fair  Recall:  [employer] of Knowledge:  Fair  Language:  Fair  Akathisia:  No  Handed:  Right  AIMS (if indicated):     Assets:  Communication Skills Desire for Improvement Financial Resources/Insurance Housing Physical Health Resilience Social Support Transportation Vocational/Educational  ADL's:  Intact  Cognition:  WNL  Sleep:  Number of Hours: 7.5     Treatment Plan Summary: Daily contact with patient to assess and evaluate symptoms and progress in treatment and Medication management   Mr. Caterino is a 22 year old male with a history of depression, anxiety and psychosis admitted for psychotic break. While compliant with meds, he continues to be disorganized and delusional, with grandiosity and has NO insight of his mental illness. He doesn't seem to understand what is reality, what is not. So far, he has not responded to the current regimen.  #Psychosis/ Mania, no improvement -decrease Depakote to 1000 mg nightly  -Zyprexa 15 mg BID  -discontinue Ativan   Cannabis use disorder -cannabis use can trigger or worsen his mania and psychosis, especially the synthetic, high potency products, which pt admitted using from [place], which is his hometown  -recommend substance abuse counseling, but pt has no intention to stop  #Labs -Lipid panel, TSH, A1C are normal -EKG: QTc is 445  #Discharge planning -discharge with family -follow up with a new psychiatrist here and in [place]  Kristine Linea, MD 12/22/2017, 1:58 PM

## 2017-12-22 NOTE — Plan of Care (Signed)
Patient is stable and cooperative , medication compliance and maintaining safety in the unit.no distress noted.  Problem: Education: Goal: Knowledge of General Education information will improve Description Including pain rating scale, medication(s)/side effects and non-pharmacologic comfort measures Outcome: Progressing   Problem: Health Behavior/Discharge Planning: Goal: Ability to manage health-related needs will improve Outcome: Progressing   Problem: Clinical Measurements: Goal: Ability to maintain clinical measurements within normal limits will improve Outcome: Progressing Goal: Will remain free from infection Outcome: Progressing Goal: Diagnostic test results will improve Outcome: Progressing Goal: Respiratory complications will improve Outcome: Progressing Goal: Cardiovascular complication will be avoided Outcome: Progressing   Problem: Activity: Goal: Risk for activity intolerance will decrease Outcome: Progressing   Problem: Nutrition: Goal: Adequate nutrition will be maintained Outcome: Progressing   Problem: Coping: Goal: Level of anxiety will decrease Outcome: Progressing   Problem: Elimination: Goal: Will not experience complications related to bowel motility Outcome: Progressing Goal: Will not experience complications related to urinary retention Outcome: Progressing   Problem: Pain Managment: Goal: General experience of comfort will improve Outcome: Progressing   Problem: Safety: Goal: Ability to remain free from injury will improve Outcome: Progressing   Problem: Skin Integrity: Goal: Risk for impaired skin integrity will decrease Outcome: Progressing

## 2017-12-22 NOTE — Progress Notes (Signed)
Recreation Therapy Notes  Date: 12/22/2017  Time: 9:30 am  Location: Craft Room  Behavioral response: Appropriate  Intervention Topic: Happiness  Discussion/Intervention:  Group content today was focused on Happiness. The group defined happiness and stated reasons they are and are not happy at times. Participants identified reasons they are normally happy and why. Individuals expressed how not being happy affects themselves and others. Patients stated reasons why happiness is important to them. The group described how they feel when they are happy. Individuals participated in the intervention "What is happiness" where they defined what happiness means to them.  Clinical Observations/Feedback:  Patient came to group and defined happiness as something that makes you smile. He identified others going out of their way to help him and his family as what makes him happy. Participant stated bad habits make him unhappy. Individual participated in group and was social with peers and staff while participating in the intervention.  Jack Wright LRT/CTRS          [name] 12/22/2017 1:14 PM

## 2017-12-22 NOTE — Progress Notes (Signed)
Patient restfully slept through the night, slept for 7.5 hours no distress , 1:1 in progress

## 2017-12-22 NOTE — Progress Notes (Signed)
Sleep is continuous with out any interruptions 1:1 in place for safety of the patient .

## 2017-12-22 NOTE — Progress Notes (Signed)
Patient is relaxed in bed with eyes closed, stable with out distress, 1: 1 continues for elopement precautions and safety noted.

## 2017-12-22 NOTE — Progress Notes (Signed)
Patient is compliant with safety guide lines and follows command, and in his room in bed with eyes open, 1;1 monitoring continues for patient safety and elopement precautions, assessed patient for any concerns, patient laughed and said I 'm ok, no issues at this time.

## 2017-12-22 NOTE — Plan of Care (Signed)
Seen patient in the common area socializing with peers and watching television without any issues, patient expressed no concerns and stated I'm doing just fine, 1:1 monitoring continues, denies any SI/HI and AVH, interacting reasonably with short sentences, smiles as he talks, affect is good and mood is bright, patient is stable this evening , compliant with his medicine and follows directions. No distress at this time.   Problem: Education: Goal: Knowledge of General Education information will improve Description Including pain rating scale, medication(s)/side effects and non-pharmacologic comfort measures Outcome: Progressing   Problem: Health Behavior/Discharge Planning: Goal: Ability to manage health-related needs will improve Outcome: Progressing   Problem: Clinical Measurements: Goal: Ability to maintain clinical measurements within normal limits will improve Outcome: Progressing Goal: Will remain free from infection Outcome: Progressing Goal: Diagnostic test results will improve Outcome: Progressing Goal: Respiratory complications will improve Outcome: Progressing Goal: Cardiovascular complication will be avoided Outcome: Progressing   Problem: Activity: Goal: Risk for activity intolerance will decrease Outcome: Progressing   Problem: Nutrition: Goal: Adequate nutrition will be maintained Outcome: Progressing   Problem: Coping: Goal: Level of anxiety will decrease Outcome: Progressing   Problem: Elimination: Goal: Will not experience complications related to bowel motility Outcome: Progressing Goal: Will not experience complications related to urinary retention Outcome: Progressing   Problem: Pain Managment: Goal: General experience of comfort will improve Outcome: Progressing   Problem: Safety: Goal: Ability to remain free from injury will improve Outcome: Progressing   Problem: Skin Integrity: Goal: Risk for impaired skin integrity will decrease Outcome:  Progressing

## 2017-12-22 NOTE — Plan of Care (Signed)
Patient verbalizes understanding of the general information that's been provided to him and all questions/concerns have been addressed and answered at this time. Patient has not experienced any health-related complications and has remained free from infection thus far. Patient has the ability to manage his health-related needs and has worked to maintain his clinical measurements within normal limits. Patient denies anxiety, but states that his depression is "nothing serious, I don't want to be here". Patient remains free from injury and is safe on the unit at this time.  Problem: Education: Goal: Knowledge of General Education information will improve Description Including pain rating scale, medication(s)/side effects and non-pharmacologic comfort measures Outcome: Progressing   Problem: Health Behavior/Discharge Planning: Goal: Ability to manage health-related needs will improve Outcome: Progressing   Problem: Clinical Measurements: Goal: Ability to maintain clinical measurements within normal limits will improve Outcome: Progressing Goal: Will remain free from infection Outcome: Progressing Goal: Diagnostic test results will improve Outcome: Progressing Goal: Respiratory complications will improve Outcome: Progressing Goal: Cardiovascular complication will be avoided Outcome: Progressing   Problem: Activity: Goal: Risk for activity intolerance will decrease Outcome: Progressing   Problem: Nutrition: Goal: Adequate nutrition will be maintained Outcome: Progressing   Problem: Coping: Goal: Level of anxiety will decrease Outcome: Progressing   Problem: Elimination: Goal: Will not experience complications related to bowel motility Outcome: Progressing Goal: Will not experience complications related to urinary retention Outcome: Progressing   Problem: Pain Managment: Goal: General experience of comfort will improve Outcome: Progressing   Problem: Safety: Goal: Ability to  remain free from injury will improve Outcome: Progressing   Problem: Skin Integrity: Goal: Risk for impaired skin integrity will decrease Outcome: Progressing

## 2017-12-22 NOTE — Progress Notes (Signed)
D- Patient alert and oriented. Patient presents in a pleasant mood stating that he slept "amazingly well" last night and the only complaint that he had was of a headache, rating his pain level a "3/10", but did not request any pain medication stating "tylenol doesn't work". Patient denies anxiety, however, he endorses depression, but did not rate it stating that it's "nothing serious, I just don't want to be here". Patient denies SI, HI, AVH, at this time. Patient's goal for today is to "get done with 1:1 and take steps towards leaving".  A- Scheduled medications administered to patient, per MD orders. Support and encouragement provided.  Routine safety checks conducted every 15 minutes.  Patient informed to notify staff with problems or concerns.  R- No adverse drug reactions noted. Patient contracts for safety at this time. Patient compliant with medications and treatment plan. Patient receptive, calm, and cooperative. Patient interacts well with others on the unit.  Patient remains safe at this time.

## 2017-12-23 DIAGNOSIS — F312 Bipolar disorder, current episode manic severe with psychotic features: Secondary | ICD-10-CM | POA: Diagnosis not present

## 2017-12-23 MED ORDER — DIVALPROEX SODIUM 500 MG PO DR TAB
500.0000 mg | DELAYED_RELEASE_TABLET | Freq: Once | ORAL | Status: AC
  Administered 2017-12-23: 500 mg via ORAL
  Filled 2017-12-23: qty 1

## 2017-12-23 MED ORDER — TEMAZEPAM 15 MG PO CAPS: 15.0000 mg | ORAL_CAPSULE | Freq: Every evening | ORAL | Status: DC | PRN

## 2017-12-23 MED ORDER — DIVALPROEX SODIUM 500 MG PO DR TAB
500.0000 mg | DELAYED_RELEASE_TABLET | Freq: Three times a day (TID) | ORAL | Status: DC
  Administered 2017-12-24: 500 mg via ORAL
  Filled 2017-12-23: qty 1

## 2017-12-23 MED ORDER — DIVALPROEX SODIUM 500 MG PO DR TAB
1000.0000 mg | DELAYED_RELEASE_TABLET | Freq: Every day | ORAL | Status: AC
  Administered 2017-12-23: 1000 mg via ORAL
  Filled 2017-12-23: qty 2

## 2017-12-23 NOTE — [facility] Group Notes (Signed)
[facility] Group Notes:  (Nursing/MHT/Case Management/Adjunct)  Date:  12/23/2017  Time:  9:39 AM  Type of Therapy:  Psychoeducational Skills  Participation Level:  Active  Participation Quality:  Appropriate, Attentive, Sharing and Supportive  Affect:  Appropriate  Cognitive:  Alert, Appropriate and Oriented  Insight:  Good  Engagement in Group:  Engaged  Modes of Intervention:  Discussion and Support  Summary of Progress/Problems:  Jack Wright 12/23/2017, 9:39 AM

## 2017-12-23 NOTE — Progress Notes (Signed)
CSW spoke to pt and to pt parents regarding follow up.  Pt is asking to come back to [facility] to attend groups for his follow up and CSW explained that is not possible.  Pt mentioned a counselor named John that he used to see in [place] in the past that he liked--said his parents would have a contact number.  Parents had asked for a provider list for the [place] area, which was compiled by another CSW yesterday.  Parents do not want pt seen for medication at [facility] because they heard in was a Designer, jewellery and they want pt seen by a psychiatrist.  They will find the contact number for therapist Jenny Reichmann and would also like to find out availability for Warner Mccreedy, therapist at [facility] center as pt met with his before.  CSW spoke to Dr Helyn Numbers does not take [employer].    Winferd Humphrey, MSW, LCSW Clinical Social Worker 12/23/2017 9:45 AM

## 2017-12-23 NOTE — Progress Notes (Signed)
D- Patient alert and oriented. Patient presents in a pleasant mood on assessment stating that he slept "pretty well" last night and had no complaints or concerns to voice to this Clinical research associatewriter. Patient rates his depression a "2/10" stating that "being here" is making him feel this way, but denies any anxiety. Patient also denies SI, HI, AVH, and pain at this time. Patient's goal for today is to "hopefully get discharged".  A- Scheduled medications administered to patient, per MD orders. Support and encouragement provided.  Routine safety checks conducted every 15 minutes.  Patient informed to notify staff with problems or concerns.  R- No adverse drug reactions noted. Patient contracts for safety at this time. Patient compliant with medications and treatment plan. Patient receptive, calm, and cooperative. Patient interacts well with others on the unit.  Patient remains safe at this time.

## 2017-12-23 NOTE — [facility] Group Notes (Signed)
[facility] Group Notes:  (Nursing/MHT/Case Management/Adjunct)  Date:  12/23/2017  Time:  9:12 PM  Type of Therapy:  Group Therapy    Participation Level:  Active  Participation Quality:  Appropriate  Affect:  Appropriate  Cognitive:  Alert  Insight:  Good  Engagement in Group:  Supportive  Modes of Intervention:  Support  Summary of Progress/Problems:  Jack NeerJackie Wright Jack Wright 12/23/2017, 9:12 PM

## 2017-12-23 NOTE — [facility] Suicide Risk Assessment (Signed)
[facility] INPATIENT:  Family/Significant Other Suicide Prevention Education  Suicide Prevention Education:  Education Completed; Jack Wright and Jack Wright, parents, [phone], has been identified by the patient as the family member/significant other with whom the patient will be residing, and identified as the person(s) who will aid the patient in the event of a mental health crisis (suicidal ideations/suicide attempt).  With written consent from the patient, the family member/significant other has been provided the following suicide prevention education, prior to the and/or following the discharge of the patient.  The suicide prevention education provided includes the following:  Suicide risk factors  Suicide prevention and interventions  National Suicide Hotline telephone number  [facility] assessment telephone number  [facility] Emergency Assistance 911  [facility] and/or Residential Mobile Crisis Unit telephone number  Request made of family/significant other to:  Remove weapons (e.g., guns, rifles, knives), all items previously/currently identified as safety concern.    Remove drugs/medications (over-the-counter, prescriptions, illicit drugs), all items previously/currently identified as a safety concern.  The family member/significant other verbalizes understanding of the suicide prevention education information provided.  The family member/significant other agrees to remove the items of safety concern listed above.  Jack Wright, Jack Swiney Jon, LCSW 12/23/2017, 9:29 AM

## 2017-12-23 NOTE — Tx Team (Signed)
Interdisciplinary Treatment and Diagnostic Plan Update  12/23/2017 Time of Session: 0830am Malikhi Ogan MRN: [id number]  Principal Diagnosis: Bipolar affective disorder, manic, severe, with psychotic behavior (HCC)  Secondary Diagnoses: Principal Problem:   Bipolar affective disorder, manic, severe, with psychotic behavior (HCC) Active Problems:   Cannabis use disorder, moderate, dependence (HCC)   Tobacco use disorder   Current Medications:  Current Facility-Administered Medications  Medication Dose Route Frequency Provider Last Rate Last Dose  . acetaminophen (TYLENOL) tablet 650 mg  650 mg Oral Q6H PRN Clapacs, John T, MD      . alum & mag hydroxide-simeth (MAALOX/MYLANTA) 200-200-20 MG/5ML suspension 30 mL  30 mL Oral Q4H PRN Clapacs, John T, MD      . divalproex (DEPAKOTE) DR tablet 500 mg  500 mg Oral Q12H Pucilowska, Jolanta B, MD   500 mg at 12/23/17 0807  . hydrOXYzine (ATARAX/VISTARIL) tablet 50 mg  50 mg Oral TID PRN Clapacs, Jackquline Denmark, MD   50 mg at 12/20/17 2126  . magnesium hydroxide (MILK OF MAGNESIA) suspension 30 mL  30 mL Oral Daily PRN Clapacs, John T, MD      . OLANZapine zydis (ZYPREXA) disintegrating tablet 15 mg  15 mg Oral BID He, Jun, MD   15 mg at 12/23/17 0807   PTA Medications: No medications prior to admission.    Patient Stressors: Health problems Medication change or noncompliance Substance abuse  Patient Strengths: Average or above average intelligence Capable of independent living Supportive family/friends  Treatment Modalities: Medication Management, Group therapy, Case management,  1 to 1 session with clinician, Psychoeducation, Recreational therapy.   Physician Treatment Plan for Primary Diagnosis: Bipolar affective disorder, manic, severe, with psychotic behavior (HCC) Long Term Goal(s): Improvement in symptoms so as ready for discharge Improvement in symptoms so as ready for discharge   Short Term Goals: Ability to identify  changes in lifestyle to reduce recurrence of condition will improve Ability to verbalize feelings will improve Ability to disclose and discuss suicidal ideas Ability to demonstrate self-control will improve Ability to identify and develop effective coping behaviors will improve Ability to maintain clinical measurements within normal limits will improve Compliance with prescribed medications will improve Ability to identify triggers associated with substance abuse/mental health issues will improve Ability to identify changes in lifestyle to reduce recurrence of condition will improve Ability to demonstrate self-control will improve Ability to identify triggers associated with substance abuse/mental health issues will improve  Medication Management: Evaluate patient's response, side effects, and tolerance of medication regimen.  Therapeutic Interventions: 1 to 1 sessions, Unit Group sessions and Medication administration.  Evaluation of Outcomes: Progressing  Physician Treatment Plan for Secondary Diagnosis: Principal Problem:   Bipolar affective disorder, manic, severe, with psychotic behavior (HCC) Active Problems:   Cannabis use disorder, moderate, dependence (HCC)   Tobacco use disorder  Long Term Goal(s): Improvement in symptoms so as ready for discharge Improvement in symptoms so as ready for discharge   Short Term Goals: Ability to identify changes in lifestyle to reduce recurrence of condition will improve Ability to verbalize feelings will improve Ability to disclose and discuss suicidal ideas Ability to demonstrate self-control will improve Ability to identify and develop effective coping behaviors will improve Ability to maintain clinical measurements within normal limits will improve Compliance with prescribed medications will improve Ability to identify triggers associated with substance abuse/mental health issues will improve Ability to identify changes in lifestyle to  reduce recurrence of condition will improve Ability to demonstrate self-control will improve  Ability to identify triggers associated with substance abuse/mental health issues will improve     Medication Management: Evaluate patient's response, side effects, and tolerance of medication regimen.  Therapeutic Interventions: 1 to 1 sessions, Unit Group sessions and Medication administration.  Evaluation of Outcomes: Progressing   RN Treatment Plan for Primary Diagnosis: Bipolar affective disorder, manic, severe, with psychotic behavior (HCC) Long Term Goal(s): Knowledge of disease and therapeutic regimen to maintain health will improve  Short Term Goals: Ability to participate in decision making will improve, Ability to verbalize feelings will improve, Ability to disclose and discuss suicidal ideas, Ability to identify and develop effective coping behaviors will improve and Compliance with prescribed medications will improve  Medication Management: RN will administer medications as ordered by provider, will assess and evaluate patient's response and provide education to patient for prescribed medication. RN will report any adverse and/or side effects to prescribing provider.  Therapeutic Interventions: 1 on 1 counseling sessions, Psychoeducation, Medication administration, Evaluate responses to treatment, Monitor vital signs and CBGs as ordered, Perform/monitor CIWA, COWS, AIMS and Fall Risk screenings as ordered, Perform wound care treatments as ordered.  Evaluation of Outcomes: Progressing   LCSW Treatment Plan for Primary Diagnosis: Bipolar affective disorder, manic, severe, with psychotic behavior (HCC) Long Term Goal(s): Safe transition to appropriate next level of care at discharge, Engage patient in therapeutic group addressing interpersonal concerns.  Short Term Goals: Engage patient in aftercare planning with referrals and resources  Therapeutic Interventions: Assess for all  discharge needs, 1 to 1 time with Social worker, Explore available resources and support systems, Assess for adequacy in community support network, Educate family and significant other(s) on suicide prevention, Complete Psychosocial Assessment, Interpersonal group therapy.  Evaluation of Outcomes: Progressing   Progress in Treatment: Attending groups: Yes Participating in groups: Yes Taking medication as prescribed: Yes. Toleration medication: Yes. Family/Significant other contact made: Yes, individual(s) contacted:  parents Patient understands diagnosis: No. Discussing patient identified problems/goals with staff: Yes. Medical problems stabilized or resolved: Yes Denies suicidal/homicidal ideation: Yes. Issues/concerns per patient self-inventory: No. Other: NA  New problem(s) identified: No, Describe:  None reported  New Short Term/Long Term Goal(s):"Live a long and healthy sustainable life"  Patient Goals:  "Live a long and healthy sustainable life"  Discharge Plan or Barriers: Pt will return home and follow up with outpatient treatment  Reason for Continuation of Hospitalization: Medication stabilization  Estimated Length of Stay:1 day  Recreational Therapy: Patient Stressors: N/A Patient Goal: Patient will focus on task/topic with 2 prompts from staff within 5 recreation therapy group sessions  Attendees: Patient: 12/23/2017   Physician: Lilia ProJolenta Pucilowska, MD 12/23/2017   Nursing: Hulan AmatoGwen Farrish, RN 12/23/2017   RN Care Manager: 12/23/2017   Social Worker: Daleen SquibbGreg Kawika Bischoff, LCSW 12/23/2017   Recreational Therapist: Danella DeisShay. Outlaw CTRS, LRT 12/23/2017   Other:  12/23/2017   Other:  12/23/2017   Other: 12/23/2017     Scribe for Treatment Team: Lorri FrederickWierda, Keshonna Valvo Jon, LCSW 12/23/2017 12:44 PM

## 2017-12-23 NOTE — Progress Notes (Signed)
[facility] MD Progress Note  12/23/2017 11:17 AM Jack Wright  MRN:  [id number]  Subjective:   Jack Wright is more pressured and delusional today after we lowered Depakote to 1000 mg. He is full of grandiose ideas again. Otherwise, there are no problems reported. He did not sleep well last night tossing and turning.  Met with his parents. We are planning discharge for tomorrow if possible, if the patient stable.  Principal Problem: Bipolar affective disorder, manic, severe, with psychotic behavior ([place]) Diagnosis: Principal Problem:   Bipolar affective disorder, manic, severe, with psychotic behavior ([place]) Active Problems:   Cannabis use disorder, moderate, dependence ([place])   Tobacco use disorder  Total Time spent with patient: 30 minutes  Past Psychiatric History: bipolar disorder  Past Medical History:  Past Medical History:  Diagnosis Date  . Anxiety   . Headache    History reviewed. No pertinent surgical history. Family History: History reviewed. No pertinent family history. Family Psychiatric  History: none Social History:  Social History   Substance and Sexual Activity  Alcohol Use Not on file     Social History   Substance and Sexual Activity  Drug Use Not on file    Social History   Socioeconomic History  . Marital status: Single    Spouse name: Not on file  . Number of children: Not on file  . Years of education: Not on file  . Highest education level: Not on file  Occupational History  . Not on file  Social Needs  . Financial resource strain: Not on file  . Food insecurity:    Worry: Not on file    Inability: Not on file  . Transportation needs:    Medical: Not on file    Non-medical: Not on file  Tobacco Use  . Smoking status: Current Every Day Smoker    Packs/day: 0.25    Years: 2.00    Pack years: 0.50    Types: Cigarettes  . Smokeless tobacco: Current User  . Tobacco comment: coop  Substance and Sexual Activity  . Alcohol use: Not on  file  . Drug use: Not on file  . Sexual activity: Not on file  Lifestyle  . Physical activity:    Days per week: Not on file    Minutes per session: Not on file  . Stress: Not on file  Relationships  . Social connections:    Talks on phone: Not on file    Gets together: Not on file    Attends religious service: Not on file    Active member of club or organization: Not on file    Attends meetings of clubs or organizations: Not on file    Relationship status: Not on file  Other Topics Concern  . Not on file  Social History Narrative  . Not on file   Additional Social History:                         Sleep: Fair  Appetite:  Fair  Current Medications: Current Facility-Administered Medications  Medication Dose Route Frequency Provider Last Rate Last Dose  . acetaminophen (TYLENOL) tablet 650 mg  650 mg Oral Q6H PRN Clapacs, John T, MD      . alum & mag hydroxide-simeth (MAALOX/MYLANTA) 200-200-20 MG/5ML suspension 30 mL  30 mL Oral Q4H PRN Clapacs, John T, MD      . divalproex (DEPAKOTE) DR tablet 500 mg  500 mg Oral Q12H Fareeda Downard  B, MD   500 mg at 12/23/17 0807  . divalproex (DEPAKOTE) DR tablet 500 mg  500 mg Oral Once Derry Kassel B, MD      . hydrOXYzine (ATARAX/VISTARIL) tablet 50 mg  50 mg Oral TID PRN Clapacs, Madie Reno, MD   50 mg at 12/20/17 2126  . magnesium hydroxide (MILK OF MAGNESIA) suspension 30 mL  30 mL Oral Daily PRN Clapacs, John T, MD      . OLANZapine zydis (ZYPREXA) disintegrating tablet 15 mg  15 mg Oral BID He, Jun, MD   15 mg at 12/23/17 0807    Lab Results:  Results for orders placed or performed during the hospital encounter of 12/17/17 (from the past 48 hour(s))  Valproic acid level     Status: Abnormal   Collection Time: 12/21/17  8:17 PM  Result Value Ref Range   Valproic Acid Lvl 105 (H) 50.0 - 100.0 ug/mL    Comment: Performed at [facility], [address] [place], [place] [address]  Ammonia     Status:  Abnormal   Collection Time: 12/21/17  8:17 PM  Result Value Ref Range   Ammonia 51 (H) 9 - 35 umol/L    Comment: Performed at [facility], [address] [place], [place] [address]    Blood Alcohol level:  Lab Results  Component Value Date   [facility] <10 82/95/6213    Metabolic Disorder Labs: Lab Results  Component Value Date   HGBA1C 4.2 (L) 12/17/2017   MPG 73.84 12/17/2017   No results found for: PROLACTIN Lab Results  Component Value Date   CHOL 186 12/17/2017   TRIG 41 12/17/2017   HDL 52 12/17/2017   CHOLHDL 3.6 12/17/2017   VLDL 8 12/17/2017   LDLCALC 126 (H) 12/17/2017    Physical Findings: AIMS:  , ,  ,  ,    CIWA:  CIWA-Ar Total: 2 COWS:  COWS Total Score: 2  Musculoskeletal: Strength & Muscle Tone: within normal limits Gait & Station: normal Patient leans: N/A  Psychiatric Specialty Exam: Physical Exam  Nursing note and vitals reviewed. Psychiatric: His affect is labile. His speech is rapid and/or pressured. He is hyperactive. Thought content is paranoid and delusional. Cognition and memory are normal. He expresses impulsivity.    Review of Systems  Neurological: Positive for headaches.  Psychiatric/Behavioral: The patient has insomnia.   All other systems reviewed and are negative.   Blood pressure 120/74, pulse 91, temperature 97.7 F (36.5 C), temperature source Oral, resp. rate 16, height '5\' 7"'$  (1.702 m), weight 68.5 kg, SpO2 100 %.Body mass index is 23.65 kg/m.  General Appearance: Casual  Eye Contact:  Good  Speech:  Pressured  Volume:  Increased  Mood:  Euphoric  Affect:  Congruent  Thought Process:  Goal Directed and Descriptions of Associations: Intact  Orientation:  Full (Time, Place, and Person)  Thought Content:  Delusions and Paranoid Ideation  Suicidal Thoughts:  No  Homicidal Thoughts:  No  Memory:  Immediate;   Fair Recent;   Fair Remote;   Fair  Judgement:  Impaired  Insight:  Lacking  Psychomotor Activity:   Increased  Concentration:  Concentration: Fair and Attention Span: Fair  Recall:  [employer] of Knowledge:  Fair  Language:  Fair  Akathisia:  No  Handed:  Right  AIMS (if indicated):     Assets:  Communication Skills Desire for Improvement Financial Resources/Insurance Housing Physical Health Resilience Social Support Transportation Vocational/Educational  ADL's:  Intact  Cognition:  WNL  Sleep:  Number of Hours: 5.5     Treatment Plan Summary: Daily contact with patient to assess and evaluate symptoms and progress in treatment and Medication management   Mr. Pemble is a 22 year old male with a history of depression, anxiety and psychosis admitted for psychotic break. While compliant with meds, he continues to be disorganized and delusional, with grandiosity and has NO insight of his mental illness. He doesn't seem to understand what is reality, what is not. So far, he has not responded to the current regimen.  #Psychosis/ Mania, no improvement -increase depakote to 500 mg TID  -Zyprexa 15 mg BID  -discontinue Ativan  -Restoril 15 mg nightly  Cannabis use disorder -cannabis use can trigger or worsen his mania and psychosis, especially the synthetic, high potency products, which pt admitted using from [place], [place] his hometown  -recommend substance abuse counseling, but pt has no intention to stop  #Labs -Lipid panel, TSH, [facility] normal -EKG: QTc is 445  #Discharge planning -discharge with family -follow up with a new psychiatrist here and in [place], MD 12/23/2017, 11:17 AM

## 2017-12-23 NOTE — BHH Group Notes (Signed)
Overcoming Obstacles  12/23/2017 1PM  Type of Therapy and Topic:  Group Therapy:  Overcoming Obstacles  Participation Level:  Did Not Attend    Description of Group:    In this group patients will be encouraged to explore what they see as obstacles to their own wellness and recovery. They will be guided to discuss their thoughts, feelings, and behaviors related to these obstacles. The group will process together ways to cope with barriers, with attention given to specific choices patients can make. Each patient will be challenged to identify changes they are motivated to make in order to overcome their obstacles. This group will be process-oriented, with patients participating in exploration of their own experiences as well as giving and receiving support and challenge from other group members.   Therapeutic Goals: 1. Patient will identify personal and current obstacles as they relate to admission. 2. Patient will identify barriers that currently interfere with their wellness or overcoming obstacles.  3. Patient will identify feelings, thought process and behaviors related to these barriers. 4. Patient will identify two changes they are willing to make to overcome these obstacles:      Summary of Patient Progress     Therapeutic Modalities:   Cognitive Behavioral Therapy Solution Focused Therapy Motivational Interviewing Relapse Prevention Therapy    Lowella Dandyarren Kayona Foor, MSW, LCSW 12/23/2017 3:42 PM

## 2017-12-23 NOTE — Progress Notes (Signed)
Recreation Therapy Notes   Date: 12/23/2017  Time: 9:30 am  Location: Craft Room  Behavioral response: Appropriate  Intervention Topic: Emotions   Discussion/Intervention:  Group content on today was focused on emotions. The group identified what emotions are and why it is important to have emotions. Patients expressed some positive and negative emotions. Individuals gave some past experiences on how they normally dealt with emotions in the past. The group described some positive ways to deal with emotions in the future. Patients participated in the intervention "Name the Megan SalonSong" where individuals were given a chance to experience different emotions.  Clinical Observations/Feedback:  Patient came to group and defined emotions as how you respond to things. He stated emotions come from past experience and how you nor,mally deal with things. Individual participated in group and was social with peers and staff while participating in the intervention.  Keyarah Mcroy LRT/CTRS         Lanyiah Brix 12/23/2017 11:41 AM

## 2017-12-24 DIAGNOSIS — F312 Bipolar disorder, current episode manic severe with psychotic features: Secondary | ICD-10-CM | POA: Diagnosis not present

## 2017-12-24 LAB — VALPROIC ACID LEVEL: Valproic Acid Lvl: 124 ug/mL — ABNORMAL HIGH (ref 50.0–100.0)

## 2017-12-24 LAB — AMMONIA: Ammonia: 39 umol/L — ABNORMAL HIGH (ref 9–35)

## 2017-12-24 MED ORDER — OLANZAPINE 15 MG PO TBDP: 15.0000 mg | ORAL_TABLET | Freq: Two times a day (BID) | ORAL | 1 refills | Status: AC

## 2017-12-24 MED ORDER — TEMAZEPAM 15 MG PO CAPS: 15.0000 mg | ORAL_CAPSULE | Freq: Every evening | ORAL | 0 refills | Status: AC | PRN

## 2017-12-24 MED ORDER — DIVALPROEX SODIUM 500 MG PO DR TAB: 500.0000 mg | DELAYED_RELEASE_TABLET | Freq: Three times a day (TID) | ORAL | 1 refills | Status: AC

## 2017-12-24 NOTE — Plan of Care (Signed)
Patient is A&O x 4, ambulates the unit with a steady gait. Patient denies having thought of wanting to harm himself. Compliant with his meal and medications. Reports his energy level as being normal, concentration, depression a 2, anxiety a 1. His goal for today is to stick with healthy habits and stay consistent. Possible discharge today, denies SI/HI/AVH and pain at this time. Will continue to monitor.

## 2017-12-24 NOTE — Progress Notes (Signed)
Patient alert and oriented x 4. Ambulates unit with steady gait. Verbally denies SI/HI/AVH and pain. Patient discharged on above date and time. Verbalized understanding the discharge information provided to patient upon discharge. Patient departed unit with discharge paperwork, prescriptions and personal belongings. Picked up by his parents to go back to campus. Plans to follow up with outpatient services.

## 2017-12-24 NOTE — [facility] Suicide Risk Assessment (Signed)
[facility]   Principal Problem: Bipolar affective disorder, manic, severe, with psychotic behavior ([facility]) Discharge Diagnoses: Principal Problem:   Bipolar affective disorder, manic, severe, with psychotic behavior ([facility]) Active Problems:   Cannabis use disorder, moderate, dependence ([facility])   Tobacco use disorder   Total Time spent with patient: 20 minutes  Musculoskeletal: Strength & Muscle Tone: within normal limits Gait & Station: normal Patient leans: N/A  Psychiatric Specialty Exam: Review of Systems  Neurological: Negative.   Psychiatric/Behavioral: Negative.   All other systems reviewed and are negative.   Blood pressure 130/83, pulse 83, temperature (!) 97.5 F (36.4 C), temperature source Oral, resp. rate 18, height 5\' 7"  (1.702 m), weight 68.5 kg, SpO2 100 %.Body mass index is 23.65 kg/m.  General Appearance: Casual  Eye Contact::  Good  Speech:  Clear and Coherent409  Volume:  Normal  Mood:  Euphoric  Affect:  Appropriate  Thought Process:  Goal Directed and Descriptions of Associations: Intact  Orientation:  Full (Time, Place, and Person)  Thought Content:  Delusions  Suicidal Thoughts:  No  Homicidal Thoughts:  No  Memory:  Immediate;   Fair Recent;   Fair Remote;   Fair  Judgement:  Poor  Insight:  Shallow  Psychomotor Activity:  Normal  Concentration:  Fair  Recall:  [employer]  Fund of Knowledge:Fair  Language: Fair  Akathisia:  No  Handed:  Right  AIMS (if indicated):     Assets:  Communication Skills Desire for Improvement Financial Resources/Insurance Housing Physical Health Resilience Social Support Transportation Vocational/Educational  Sleep:  Number of Hours: 6.15  Cognition: WNL  ADL's:  Intact   Mental Status Per Nursing Assessment::   On Admission:  NA  Demographic Factors:  Male, Adolescent or young adult and Caucasian  Loss Factors: NA  Historical Factors: Prior suicide attempts and  Impulsivity  Risk Reduction Factors:   Sense of responsibility to family and Positive social support  Continued Clinical Symptoms:  Bipolar Disorder:   Mixed State Alcohol/Substance Abuse/Dependencies  Cognitive Features That Contribute To Risk:  None    Suicide Risk:  Minimal: No identifiable suicidal ideation.  Patients presenting with no risk factors but with morbid ruminations; may be classified as minimal risk based on the severity of the depressive symptoms  Follow-up Information    [facility] Go on 01/19/2018.   Why:  Please attend your medication appt with Berton BonMorrow Dowdle, PA, on Tuesday, 01/19/17, at 2:20pm.  If unable to keep this appt, please give 24 hours notice.  Contact information: [address] [place] [address] [phone]        [facility] Go [facility]/2017.   Why:  Please attend your therapy appt with Sheffield Sliderhris Troxler on Friday, 12/25/17, at 9:15am.   Contact information: [address] [place], [place] [address] P: [phone] F: [phone]          Plan Of Care/Follow-up recommendations:  Activity:  as tolerated Diet:  regular Other:  keep follow up appointments  Kristine LineaJolanta Atwood Adcock, MD 12/24/2017, 10:07 AM

## 2017-12-24 NOTE — Progress Notes (Signed)
Recreation Therapy Notes  INPATIENT RECREATION TR PLAN  Patient Details Name: Jack Wright MRN: [id number] DOB: Jul 03, 1995 Today's Date: 12/24/2017  Rec Therapy Plan Is patient appropriate for Therapeutic Recreation?: Yes Treatment times per week: at least 3 Estimated Length of Stay: 5-7 days TR Treatment/Interventions: Group participation (Comment)  Discharge Criteria Pt will be discharged from therapy if:: Discharged Treatment plan/goals/alternatives discussed and agreed upon by:: Patient/family  Discharge Summary Short term goals set: Patient will focus on task/topic with 2 prompts from staff within 5 recreation therapy group sessions Short term goals met: Complete Progress toward goals comments: Groups attended Which groups?: Coping skills, Other (Comment)(Emotions, happiness, self-care) Reason goals not met: N/A Therapeutic equipment acquired: N/A Reason patient discharged from therapy: Discharge from hospital Pt/family agrees with progress & goals achieved: Yes Date patient discharged from therapy: 12/24/17   Minola Guin 12/24/2017, 1:28 PM

## 2017-12-24 NOTE — Plan of Care (Signed)
Pt. Is complaint with medications. Pt. Denies si/hi, contracts verbally for safety. Pt. Reports he can remain safe while on the unit. Pt. Continues to present with grandiose delusional statements and lacking insight into why he is here.    Problem: Education: Goal: Mental status will improve Outcome: Not Progressing   Problem: Health Behavior/Discharge Planning: Goal: Compliance with treatment plan for underlying cause of condition will improve 12/24/2017 0253 by Lenox PondsStevens, Hunt Zajicek J, RN Outcome: Progressing 12/24/2017 0253 by Lenox PondsStevens, Indalecio Malmstrom J, RN Outcome: Progressing   Problem: Safety: Goal: Periods of time without injury will increase 12/24/2017 0253 by Lenox PondsStevens, Elina Streng J, RN Outcome: Progressing 12/24/2017 0253 by Lenox PondsStevens, Petar Mucci J, RN Outcome: Progressing

## 2017-12-24 NOTE — Discharge Summary (Signed)
Physician Discharge Summary Note  Patient:  Jack Wright is an 22 y.o., male MRN:  [id number] DOB:  May 14, 1995 Patient phone:  [phone] (home)  Patient address:   [address] [place] [address],  Total Time spent with patient: 20 minutes plus 15 min on care coordination and documentation  Date of Admission:  12/17/2017 Date of Discharge: 12/24/2017  Reason for Admission:  Psychotic break.  History of Present Illness:  Identifying data. Jack Wright is a 21-year mle with a history of depression, anxiety or psychosis.  Chief complaint. "I need my lawyer."  History of present illness. Information was obtained from the patient, the chart, and family. The patient was brought to te ER floridly psychotic: delusional, grandiose and agitated requiring police intervention. Over several days prior to admission, the patient became hyperactive, insomniac and unreasonable. 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 His roommates as well as his family were concerned. The roommates had to disconnected WI-FI to stop his activities. He would not comply with his friends and father's request to seek help and police was brought to the scene. He was committed for psychiatric treatment.  The patient himself denies any problems, is elated, and demands to talk to his lawyer to be discharged immediately to continue his activities.  Spoke with the father who recalls a similar brief episode some years ago. The patient became psychotic, agitated, disrobing, unrully. Police was involved then as well.  The patient suffers severe headaches, stemming from childhood head injury, and was started recently on Effexor that was titrated to 75 mg daily by Dr. Marshell Garfinkel, his headache specialist.  Past psychiatric history. Head injury in childhood and another one later on resulting in severe headaches. Several bouts of depression, tried  on Wellbutrin and Prozac. There were many other medicines prescribed for headaches: Lamictal, Topamax, Amitriptiline. He felt suicidal several times and overdosed on Amitriptiline on impulse once.  Family psychiatric history. Father recalls a relative with mental illness,no details.  Social history. [employer] in finances. Lives in [place] with his parents and two siblings. Reports moderate drinking "trying to cut back" and cannabis. No other drugs involved. School was going well. He is unlikely be able to take 3 finals next week. Spoke with the father about medical withdrawal.  Principal Problem: Bipolar affective disorder, manic, severe, with psychotic behavior (HCC) Discharge Diagnoses: Principal Problem:   Bipolar affective disorder, manic, severe, with psychotic behavior (HCC) Active Problems:   Cannabis use disorder, moderate, dependence (HCC)   Tobacco use disorder  Past Medical History:  Past Medical History:  Diagnosis Date  . Anxiety   . Headache    History reviewed. No pertinent surgical history. Family History: History reviewed. No pertinent family history.  Social History:  Social History   Substance and Sexual Activity  Alcohol Use Not on file     Social History   Substance and Sexual Activity  Drug Use Not on file    Social History   Socioeconomic History  . Marital status: Single    Spouse name: Not on file  . Number of children: Not on file  . Years of education: Not on file  . Highest education level: Not on file  Occupational History  . Not on file  Social Needs  . Financial resource strain: Not on file  . Food insecurity:    Worry: Not on file    Inability: Not on file  . Transportation  needs:    Medical: Not on file    Non-medical: Not on file  Tobacco Use  . Smoking status: Current Every Day Smoker    Packs/day: 0.25    Years: 2.00    Pack years: 0.50    Types: Cigarettes  . Smokeless tobacco: Current User  . Tobacco  comment: coop  Substance and Sexual Activity  . Alcohol use: Not on file  . Drug use: Not on file  . Sexual activity: Not on file  Lifestyle  . Physical activity:    Days per week: Not on file    Minutes per session: Not on file  . Stress: Not on file  Relationships  . Social connections:    Talks on phone: Not on file    Gets together: Not on file    Attends religious service: Not on file    Active member of club or organization: Not on file    Attends meetings of clubs or organizations: Not on file    Relationship status: Not on file  Other Topics Concern  . Not on file  Social History Narrative  . Not on file    Hospital Course:    Jack Wright is a 22 year old male with a history of depression, anxiety and psychosis admitted for psychotic break. He was started on a combination of a mood stabilizer and antipsychotic. He tolerated medications well. At the time of discharge, the patient is no longer psychotic. He is forward thinking and optimistic about the future.   #Mood and psychosis, improved  -continue Depakote to 500 mg TID, VPA level 105, please repeat the level in one week -Zyprexa 15 mg BID  -Restoril 15 mg nightly PRN  Cannabis use disorder -cannabis use can trigger or worsen his mania and psychosis, especially the synthetic, high potency products, which pt admitted using from [place]  -recommend substance abuse counseling, but pt has no intention to stop  #Labs -Lipid panel, TSH, A1Care normal -EKG: QTc is 445  #Discharge planning -discharge with family -follow up with a new psychiatrist here and in [place]  Physical Findings: AIMS:  , ,  ,  ,    CIWA:  CIWA-Ar Total: 2 COWS:  COWS Total Score: 2  Musculoskeletal: Strength & Muscle Tone: within normal limits Gait & Station: normal Patient leans: N/A  Psychiatric Specialty Exam: Physical Exam  Nursing note and vitals reviewed. Psychiatric: He has a normal mood and affect. His  speech is normal and behavior is normal. Thought content normal. Cognition and memory are normal. He expresses impulsivity.    Review of Systems  Neurological: Positive for headaches.  Psychiatric/Behavioral: Positive for substance abuse.  All other systems reviewed and are negative.   Blood pressure 130/83, pulse 83, temperature (!) 97.5 F (36.4 C), temperature source Oral, resp. rate 18, height 5\' 7"  (1.702 m), weight 68.5 kg, SpO2 100 %.Body mass index is 23.65 kg/m.  General Appearance: Casual  Eye Contact:  Good  Speech:  Clear and Coherent  Volume:  Normal  Mood:  Euphoric  Affect:  Appropriate  Thought Process:  Goal Directed and Descriptions of Associations: Intact  Orientation:  Full (Time, Place, and Person)  Thought Content:  Delusions  Suicidal Thoughts:  No  Homicidal Thoughts:  No  Memory:  Immediate;   Fair Recent;   Fair Remote;   Fair  Judgement:  Impaired  Insight:  Shallow  Psychomotor Activity:  Normal  Concentration:  Concentration: Fair and Attention Span: Fair  Recall:  Jennelle Human of Knowledge:  Fair  Language:  Fair  Akathisia:  No  Handed:  Right  AIMS (if indicated):     Assets:  Communication Skills Desire for Improvement Financial Resources/Insurance Housing Physical Health Resilience Social Support Transportation Vocational/Educational  ADL's:  Intact  Cognition:  WNL  Sleep:  Number of Hours: 6.15     Have you used any form of tobacco in the last 30 days? (Cigarettes, Smokeless Tobacco, Cigars, and/or Pipes): Yes  Has this patient used any form of tobacco in the last 30 days? (Cigarettes, Smokeless Tobacco, Cigars, and/or Pipes) Yes, No  Blood Alcohol level:  Lab Results  Component Value Date   ETH <10 12/17/2017    Metabolic Disorder Labs:  Lab Results  Component Value Date   HGBA1C 4.2 (L) 12/17/2017   MPG 73.84 12/17/2017   No results found for: PROLACTIN Lab Results  Component Value Date   CHOL 186 12/17/2017    TRIG 41 12/17/2017   HDL 52 12/17/2017   CHOLHDL 3.6 12/17/2017   VLDL 8 12/17/2017   LDLCALC 126 (H) 12/17/2017    See Psychiatric Specialty Exam and Suicide Risk Assessment completed by Attending Physician prior to discharge.  Discharge destination:  Home  Is patient on multiple antipsychotic therapies at discharge:  No   Has Patient had three or more failed trials of antipsychotic monotherapy by history:  No  Recommended Plan for Multiple Antipsychotic Therapies: NA  Discharge Instructions    Diet - low sodium heart healthy   Complete by:  As directed    Increase activity slowly   Complete by:  As directed      Allergies as of 12/24/2017   Not on File     Medication List    TAKE these medications     Indication  divalproex 500 MG DR tablet Commonly known as:  DEPAKOTE Take 1 tablet (500 mg total) by mouth every 8 (eight) hours.  Indication:  Manic Phase of Manic-Depression   olanzapine zydis 15 MG disintegrating tablet Commonly known as:  ZYPREXA Take 1 tablet (15 mg total) by mouth 2 (two) times daily.  Indication:  Manic-Depression   temazepam 15 MG capsule Commonly known as:  RESTORIL Take 1 capsule (15 mg total) by mouth at bedtime as needed for sleep.  Indication:  Trouble Sleeping      Follow-up Information    [facility] [place]. Go on 01/19/2018.   Why:  Please attend your medication appt with Berton Bon, PA, on Tuesday, 01/19/17, at 2:20pm.  If unable to keep this appt, please give 24 hours notice.  Contact information: [address] [place] [address] [phone]        [facility]. Go on 12/25/2017.   Why:  Please attend your therapy appt with Sheffield Slider on Friday, 12/25/17, at 9:15am.   Contact information: [address] [place], [place] [address] P: [phone] F: [phone]          Follow-up recommendations:  Activity:  as tolerated Diet:  regular Other:  keep follow up  appointments  Comments:     Signed: Kristine Linea, MD 12/24/2017, 1:55 PM

## 2017-12-24 NOTE — Progress Notes (Signed)
Recreation Therapy Notes  Date: 12/24/2017  Time: 9:30 am  Location: Craft Room  Behavioral response: Appropriate  Intervention Topic: Coping skills  Discussion/Intervention:  and when they can be used. Individuals described how they normally cope with things and the coping skills they normally use. Patients expressed why it is important to cope with things and how not coping with things can affect you. The group participated in the intervention "Exploring coping skills" where they had a chance to test new coping skills they could use in the future.  Clinical Observations/Feedback:  Patient came to group late due to unknown reasons. He explained that sometimes friends, family and upbringing can affect the way people cope with things.  Individual participated in group and was social with peers and staff while participating in the intervention. Patient left group early stating "Im done". Mikeyla Music LRT/CTRS         Quency Tober 12/24/2017 11:22 AM

## 2017-12-24 NOTE — Progress Notes (Signed)
D: Pt denies SI/HI/AVH, contracts for safety. Pt is pleasant and cooperative, engages actively, but continues to present with delusions and no insight into why he is here as evidence by statements such as, "I'm here, because [employer] hacked all my data...it's why I was always getting scheduled for appointments I never made it to". Pt. has no Complaints this evening, denies anxiety and depression. Pt. Eager to discharge. Patient Interactions appropriate with staff and peers for the most part. Pt. Affect is a bit animated and euphoric. Pt. Speech pressured at times. Pt. Reports normal mood. Pt. Frequently observed in the day room watching cartoons. Pt. Attends snacks and groups appropriately. Pt. Eating good.    A: Q x 15 minute observation checks were completed for safety. Patient was provided with education, but needs reinforcement.  Patient was given/offered medications per orders. Patient  was encourage to attend groups, participate in unit activities and continue with plan of care. Pt. Chart and plans of care reviewed. Pt. Given support and encouragement.   R: Patient is complaint with medication and unit procedures with direction and encouragement.              Precautionary checks every 15 minutes for safety maintained, room free of safety hazards, patient sustains no injury or falls during this shift. Will endorse care to next shift.

## 2019-05-03 IMAGING — MR MR HEAD WO/W CM
13 series · 48 of 48 positions shown · IV contrast (gadavist)
Comparison: None.

CLINICAL DATA: Intracranial hypotension. Traumatic brain injury
with 2 concussions including loss of consciousness as a child. White
matter changes. Daily headaches. Recent bilateral pulsatile
tinnitus.

EXAM:
MRI HEAD WITHOUT AND WITH CONTRAST
TECHNIQUE: Multiplanar, multiecho pulse sequences of the brain and surrounding
structures were obtained without and with intravenous contrast.
CONTRAST:  7 mL Gadavist

[Series 5: T1 · sagittal · 5.0mm · 0.62mm/px · 3 of 24 slices shown (1 of 2)]
[im 1/24]
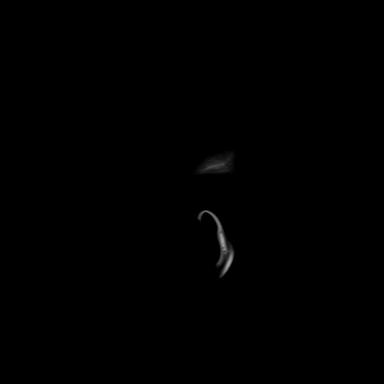
[im 12/24]
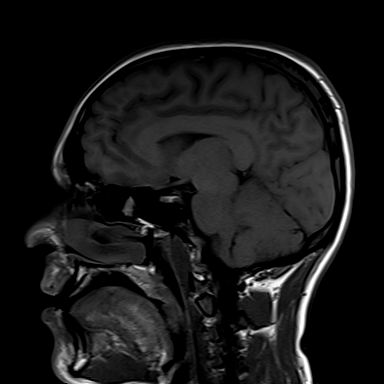
[im 24/24]
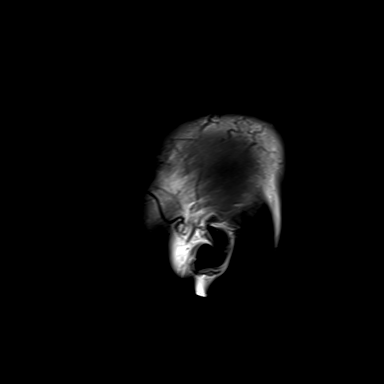

[Series 6: ax dwi_tracew · axial · 3.0mm · 0.60mm/px · z∈[-1,+156]mm · 3 of 55 slices shown]
[im 1/55]
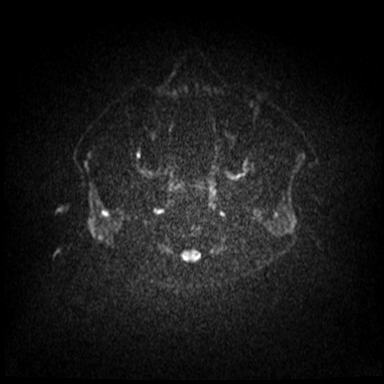
[im 28/55]
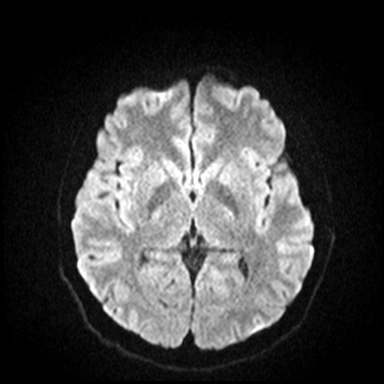
[im 55/55]
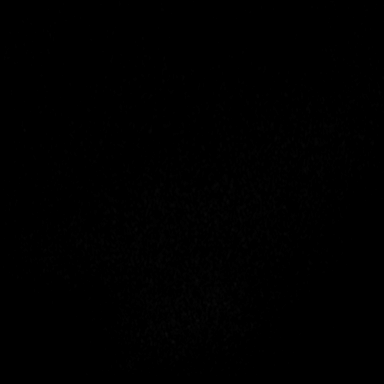

[Series 7: ax dwi_adc · axial · 3.0mm · 0.60mm/px · z∈[-1,+153]mm · 3 of 53 slices shown]
[im 1/53]
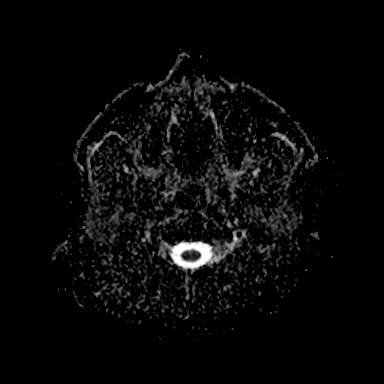
[im 27/53]
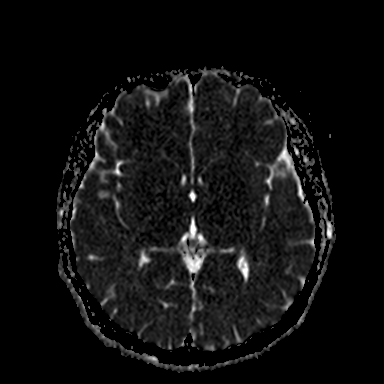
[im 53/53]
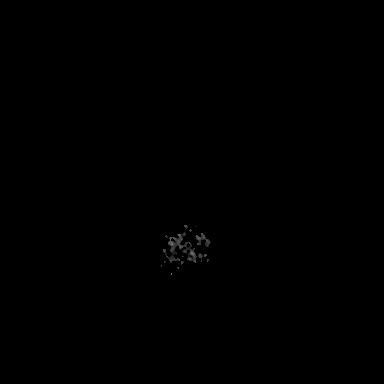

[Series 8: cor dwi_tracew · coronal · 5.0mm · 0.60mm/px · 2 of 39 slices shown]
[im 1/39]
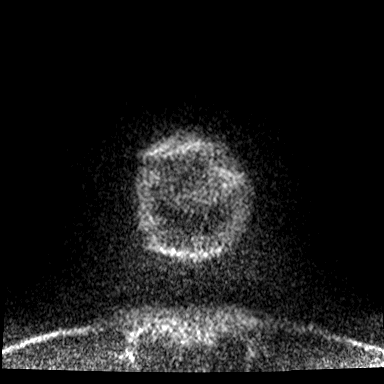
[im 39/39]
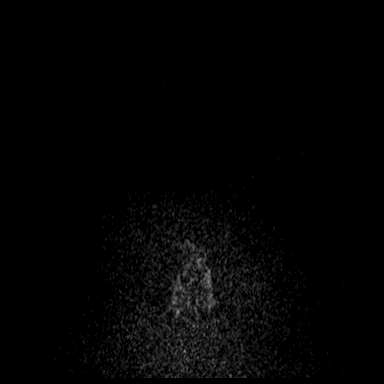

[Series 9: cor dwi_adc · coronal · 5.0mm · 0.60mm/px · 2 of 39 slices shown]
[im 1/39]
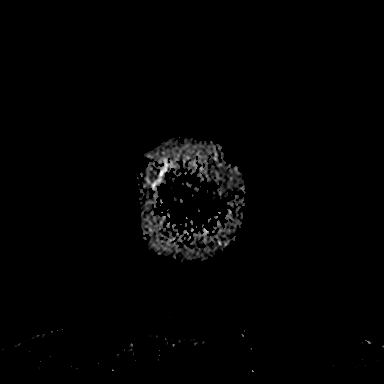
[im 39/39]
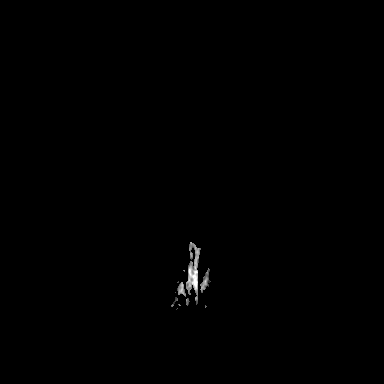

[Series 10: T2 · axial · 5.0mm · 0.53mm/px · z∈[+1,+152]mm · 2 of 27 slices shown]
[im 1/27]
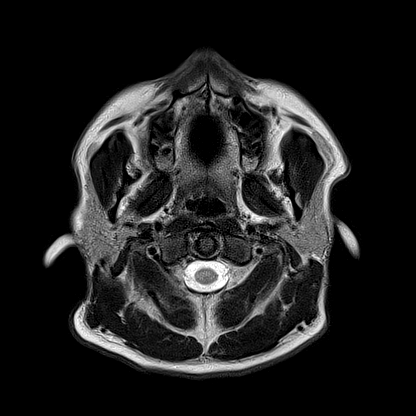
[im 27/27]
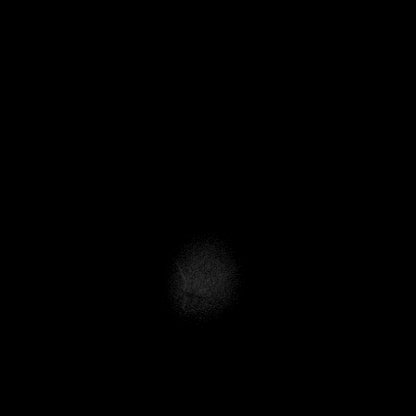

[Series 11: swi_images · axial · 3.0mm · 0.90mm/px · z∈[-8,+163]mm · 4 of 60 slices shown]
[im 1/60]
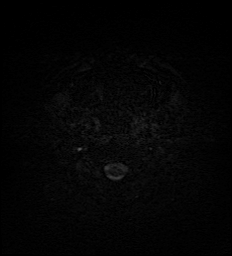
[im 20/60]
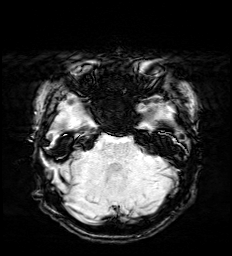
[im 40/60]
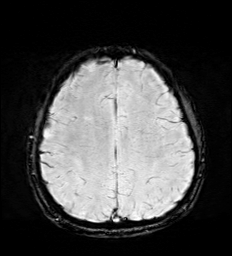
[im 60/60]
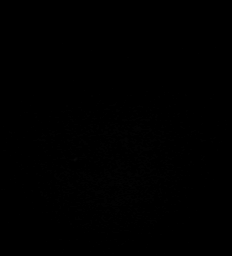

[Series 12: mip_images(sw) · axial · 24.0mm · 0.90mm/px · z∈[+2,+153]mm · 3 of 53 slices shown]
[im 1/53]
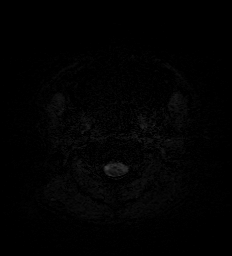
[im 27/53]
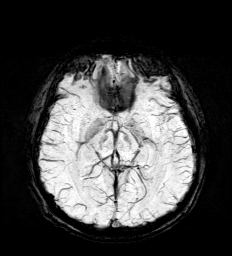
[im 53/53]
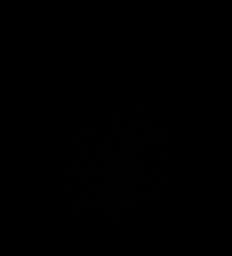

[Series 13: FLAIR · axial · 5.0mm · 1.20mm/px · z∈[+2,+153]mm · 2 of 27 slices shown]
[im 1/27]
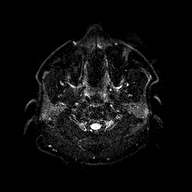
[im 27/27]
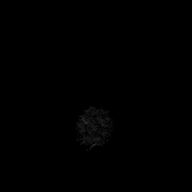

[Series 14: T1 · axial · 1.0mm · 0.98mm/px · z∈[-4,+151]mm · 10 of 160 slices shown (2 of 2)]
[im 1/160]
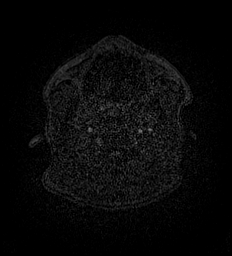
[im 18/160]
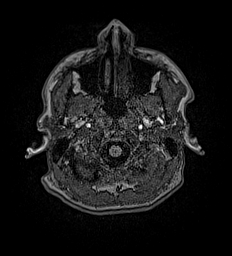
[im 36/160]
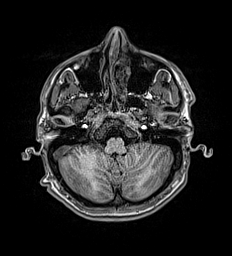
[im 54/160]
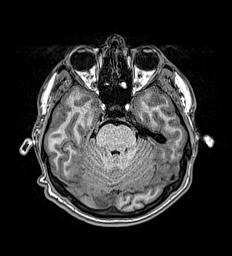
[im 71/160]
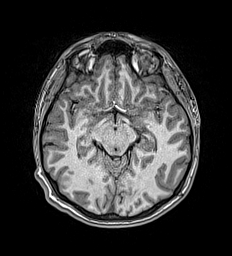
[im 89/160]
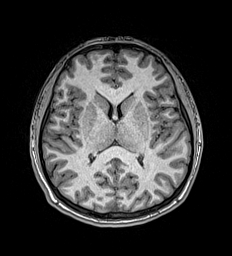
[im 107/160]
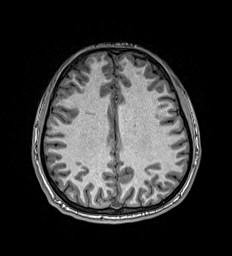
[im 124/160]
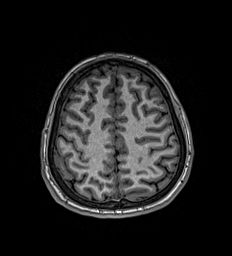
[im 142/160]
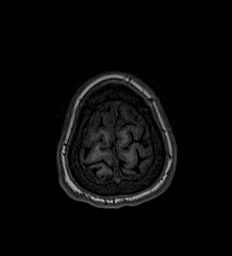
[im 160/160]
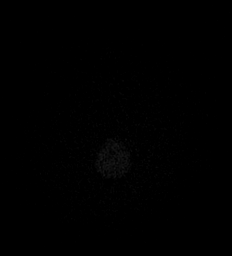

[Series 15: T2 post-contrast · coronal · 5.0mm · 0.57mm/px · 2 of 29 slices shown]
[im 1/29]
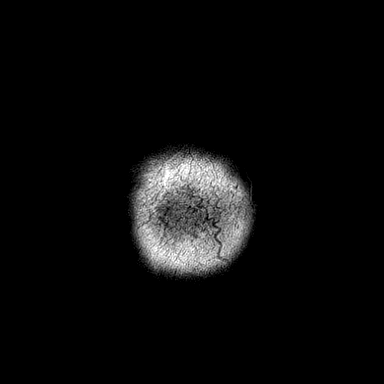
[im 29/29]
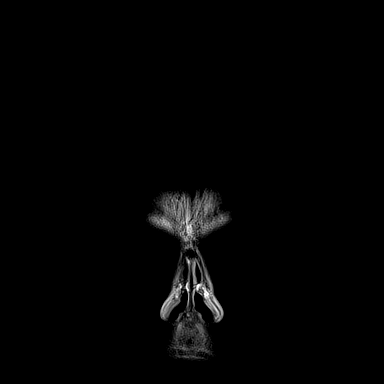

[Series 16: T1 post-contrast · axial · 1.0mm · 0.98mm/px · z∈[-4,+151]mm · 10 of 160 slices shown (1 of 2)]
[im 1/160]
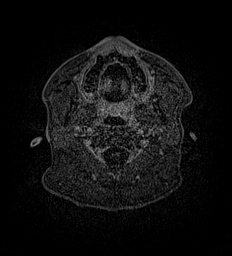
[im 18/160]
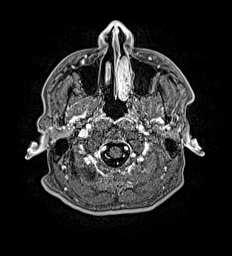
[im 36/160]
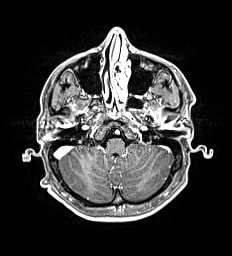
[im 54/160]
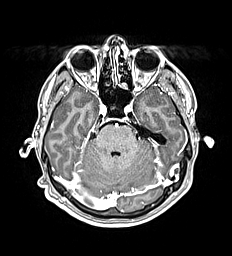
[im 71/160]
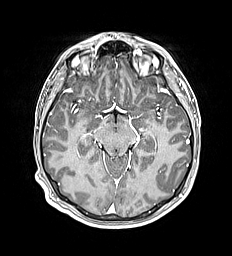
[im 89/160]
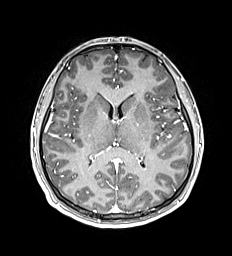
[im 107/160]
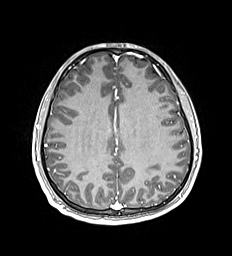
[im 124/160]
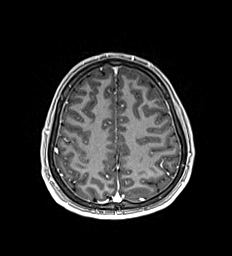
[im 142/160]
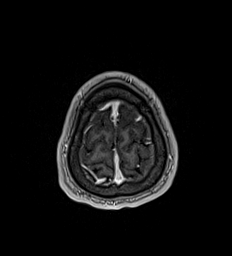
[im 160/160]
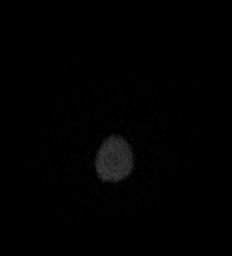

[Series 17: T1 post-contrast · coronal · 5.0mm · 0.90mm/px · 2 of 31 slices shown (2 of 2)]
[im 1/31]
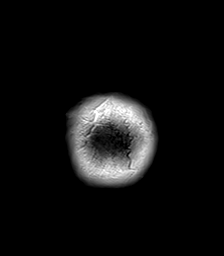
[im 31/31]
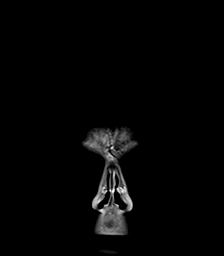

[48 of 48 positions shown; findings below may reference images not displayed]

FINDINGS: Brain: There's no evidence of acute infarct, acute or chronic
hemorrhage, midline shift, or extra-axial fluid collection. The
ventricles and sulci are normal. There is an 8 mm linear focus of T2
hyperintensity in the right centrum semiovale, and there is a 6 mm
focus of T2 hyperintensity in the right thalamus. No abnormal brain
parenchymal or meningeal enhancement is identified.

Vascular: Major intracranial vascular flow voids are preserved.

Skull and upper cervical spine: Unremarkable bone marrow signal.

Sinuses/Orbits: Unremarkable orbits. Mild mucosal thickening in the
posterior ethmoid air cells bilaterally. Clear mastoid air cells.

Other: None.
IMPRESSION: 1. Subcentimeter foci of T2 hyperintensity in the right centrum
semiovale and right thalamus, indeterminate. The centrum semiovale
abnormality could reflect the sequelae of a nonspecific remote
insult while the right thalamic abnormality is more unusual. If
possible, comparison with the patient's prior outside brain MRI
would likely be the most useful next step to determine the stability
of these findings. Otherwise, recommend follow-up contrast-enhanced
brain MRI in 3-6 months as a small right thalamic neoplasm is a
differential consideration.
2. Otherwise unremarkable appearance of the brain. No evidence of
chronic hemorrhage or other findings of a demyelinating process or
metabolic disturbance.
